# Patient Record
Sex: Female | Born: 1955 | Race: White | Hispanic: No | Marital: Married | Smoking: Former smoker
Health system: Southern US, Community
[De-identification: ages and names within clinical notes are randomized; demographics above are authoritative.]

## PROBLEM LIST (undated history)

## (undated) DIAGNOSIS — C801 Malignant (primary) neoplasm, unspecified: Secondary | ICD-10-CM

## (undated) DIAGNOSIS — N3010 Interstitial cystitis (chronic) without hematuria: Secondary | ICD-10-CM

## (undated) DIAGNOSIS — G43909 Migraine, unspecified, not intractable, without status migrainosus: Secondary | ICD-10-CM

## (undated) HISTORY — PX: ABDOMINAL HYSTERECTOMY: SHX81

---

## 2006-11-29 ENCOUNTER — Ambulatory Visit: Payer: Self-pay

## 2009-05-12 ENCOUNTER — Ambulatory Visit: Payer: Self-pay | Admitting: Family Medicine

## 2011-04-19 ENCOUNTER — Ambulatory Visit: Payer: Self-pay | Admitting: Family Medicine

## 2012-05-10 ENCOUNTER — Ambulatory Visit: Payer: Self-pay | Admitting: Family Medicine

## 2012-05-26 ENCOUNTER — Ambulatory Visit: Payer: Self-pay | Admitting: Medical

## 2014-12-30 ENCOUNTER — Ambulatory Visit: Payer: Self-pay | Admitting: Family Medicine

## 2016-04-16 ENCOUNTER — Encounter: Payer: Self-pay | Admitting: *Deleted

## 2016-04-16 ENCOUNTER — Ambulatory Visit
Admission: EM | Admit: 2016-04-16 | Discharge: 2016-04-16 | Disposition: A | Discharge status: Home / Self Care | Payer: Federal, State, Local not specified - PPO | Attending: Family Medicine | Admitting: Family Medicine

## 2016-04-16 DIAGNOSIS — B001 Herpesviral vesicular dermatitis: Secondary | ICD-10-CM | POA: Diagnosis not present

## 2016-04-16 HISTORY — DX: Migraine, unspecified, not intractable, without status migrainosus: G43.909

## 2016-04-16 HISTORY — DX: Interstitial cystitis (chronic) without hematuria: N30.10

## 2016-04-16 HISTORY — DX: Malignant (primary) neoplasm, unspecified: C80.1

## 2016-04-16 MED ORDER — PREDNISONE 50 MG PO TABS: ORAL_TABLET | ORAL | Status: DC

## 2016-04-16 MED ORDER — VALACYCLOVIR HCL 1 G PO TABS: 1000.0000 mg | ORAL_TABLET | Freq: Two times a day (BID) | ORAL | Status: AC

## 2016-04-16 MED ORDER — SULFAMETHOXAZOLE-TRIMETHOPRIM 800-160 MG PO TABS: 1.0000 | ORAL_TABLET | Freq: Two times a day (BID) | ORAL | Status: AC

## 2016-04-16 NOTE — Discharge Instructions (Signed)
Take the medications as prescribed.  Follow up if you worsen or fail to improve.  Take care  Dr. Cherre Robins Sore A cold sore (fever blister) is a skin infection caused by the herpes simplex virus (HSV-1). HSV-1 is closely related to the virus that causes genital herpes (HSV-2), but they are not the same even though both viruses can cause oral and genital infections. Cold sores are small, fluid-filled sores inside of the mouth or on the lips, gums, nose, chin, cheeks, or fingers.  The herpes simplex virus can be easily passed (contagious) to other people through close personal contact, such as kissing or sharing personal items. The virus can also spread to other parts of the body, such as the eyes or genitals. Cold sores are contagious until the sores crust over completely. They often heal within 2 weeks.  Once a person is infected, the herpes simplex virus remains permanently in the body. Therefore, there is no cure for cold sores, and they often recur when a person is tired, stressed, sick, or gets too much sun. Additional factors that can cause a recurrence include hormone changes in menstruation or pregnancy, certain drugs, and cold weather.  CAUSES  Cold sores are caused by the herpes simplex virus. The virus is spread from person to person through close contact, such as through kissing, touching the affected area, or sharing personal items such as lip balm, razors, or eating utensils.  SYMPTOMS  The first infection may not cause symptoms. If symptoms develop, the symptoms often go through different stages. Here is how a cold sore develops:   Tingling, itching, or burning is felt 1-2 days before the outbreak.   Fluid-filled blisters appear on the lips, inside the mouth, nose, or on the cheeks.   The blisters start to ooze clear fluid.   The blisters dry up and a yellow crust appears in its place.   The crust falls off.  Symptoms depend on whether it is the initial outbreak or a  recurrence. Some other symptoms with the first outbreak may include:   Fever.   Sore throat.   Headache.   Muscle aches.   Swollen neck glands.  DIAGNOSIS  A diagnosis is often made based on your symptoms and looking at the sores. Sometimes, a sore may be swabbed and then examined in the lab to make a final diagnosis. If the sores are not present, blood tests can find the herpes simplex virus.  TREATMENT  There is no cure for cold sores and no vaccine for the herpes simplex virus. Within 2 weeks, most cold sores go away on their own without treatment. Medicines cannot make the infection go away, but medicine can help relieve some of the pain associated with the sores, can work to stop the virus from multiplying, and can also shorten healing time. Medicine may be in the form of creams, gels, pills, or a shot.  HOME CARE INSTRUCTIONS   Only take over-the-counter or prescription medicines for pain, discomfort, or fever as directed by your caregiver. Do not use aspirin.   Use a cotton-tip swab to apply creams or gels to your sores.   Do not touch the sores or pick the scabs. Wash your hands often. Do not touch your eyes without washing your hands first.   Avoid kissing, oral sex, and sharing personal items until sores heal.   Apply an ice pack on your sores for 10-15 minutes to ease any discomfort.   Avoid hot, cold, or salty  foods because they may hurt your mouth. Eat a soft, bland diet to avoid irritating the sores. Use a straw to drink if you have pain when drinking out of a glass.   Keep sores clean and dry to prevent an infection of other tissues.   Avoid the sun and limit stress if these things trigger outbreaks. If sun causes cold sores, apply sunscreen on the lips before being out in the sun.  SEEK MEDICAL CARE IF:   You have a fever or persistent symptoms for more than 2-3 days.   You have a fever and your symptoms suddenly get worse.   You have pus, not  clear fluid, coming from the sores.   You have redness that is spreading.   You have pain or irritation in your eye.   You get sores on your genitals.   Your sores do not heal within 2 weeks.   You have a weakened immune system.   You have frequent recurrences of cold sores.  MAKE SURE YOU:   Understand these instructions.  Will watch your condition.  Will get help right away if you are not doing well or get worse.   This information is not intended to replace advice given to you by your health care provider. Make sure you discuss any questions you have with your health care provider.   Document Released: 11/24/2000 Document Revised: 12/18/2014 Document Reviewed: 04/10/2012 Elsevier Interactive Patient Education [employer].

## 2016-04-16 NOTE — ED Provider Notes (Signed)
CSN: [id number]     Arrival date & time 04/16/16  1327 History   First MD Initiated Contact with Patient 04/16/16 1433     Chief Complaint  Patient presents with  . Mouth Lesions   (Consider location/radiation/quality/duration/timing/severity/associated sxs/prior Treatment) HPI  60 year old female presents with complaints of a cold sore.  Patient states that on Wednesday she developed tingling of a portion of her upper lip. She subsequently developed a characteristic cold sore. This is worsened over the past few days and is now severe. She now has significant swelling and surrounding redness as well as crusting. She reports pain and tenderness. No medications or other interventions tried. No known exacerbating factors. No oral lesions. No other complaints at this time.  Past Medical History  Diagnosis Date  . Migraines   . Cancer ([place])   . Interstitial cystitis    Past Surgical History  Procedure Laterality Date  . Abdominal hysterectomy     History reviewed. No pertinent family history.   Social History  Substance Use Topics  . Smoking status: Current Every Day Smoker  . Smokeless tobacco: Never Used  . Alcohol Use: Yes   OB History    No data available     Review of Systems  Skin:       Cold sore.  All other systems reviewed and are negative.  Allergies  Penicillins  Home Medications   Prior to Admission medications   Medication Sig Start Date End Date Taking? Authorizing Provider  Ascorbic Acid (VITAMIN C PO) Take 1 tablet by mouth daily.   Yes Historical Provider, MD  aspirin 81 MG tablet Take 81 mg by mouth daily.   Yes Historical Provider, MD  losartan-hydrochlorothiazide (HYZAAR) 50-12.5 MG tablet Take 1 tablet by mouth daily.   Yes Historical Provider, MD  Multiple Vitamin (MULTIVITAMIN) tablet Take 1 tablet by mouth daily.   Yes Historical Provider, MD  pentosan polysulfate (ELMIRON) 100 MG capsule Take 100 mg by mouth 3 (three) times daily.   Yes  Historical Provider, MD  topiramate (TOPAMAX) 25 MG tablet Take 75 mg by mouth daily.   Yes Historical Provider, MD  Venlafaxine HCl 225 MG TB24 Take 225 mg by mouth daily.   Yes Historical Provider, MD  VITAMIN D, CHOLECALCIFEROL, PO Take 1 tablet by mouth daily.   Yes Historical Provider, MD  predniSONE (DELTASONE) 50 MG tablet 1 tablet daily x 5 days. 04/16/16   Coral Spikes, DO  sulfamethoxazole-trimethoprim (BACTRIM DS,SEPTRA DS) 800-160 MG tablet Take 1 tablet by mouth 2 (two) times daily. 04/16/16 04/23/16  Coral Spikes, DO  valACYclovir (VALTREX) 1000 MG tablet Take 1 tablet (1,000 mg total) by mouth 2 (two) times daily. 04/16/16 04/30/16  Coral Spikes, DO   Meds Ordered and Administered this Visit  Medications - No data to display  BP 148/78 mmHg  Pulse 82  Temp(Src) 98.1 F (36.7 C) (Oral)  Resp 18  Ht 5\' 6"  (1.676 m)  Wt 220 lb (99.791 kg)  BMI 35.53 kg/m2  SpO2 98% No data found.  Physical Exam  Constitutional: She is oriented to person, place, and time. She appears well-developed. No distress.  HENT:  Head: Normocephalic and atraumatic.  Mouth/Throat: Oropharynx is clear and moist.  Eyes: Conjunctivae are normal.  Neck: Normal range of motion.  Cardiovascular: Normal rate and regular rhythm.   Pulmonary/Chest: Effort normal. She has no wheezes. She has no rales.  Abdominal: She exhibits no distension.  Musculoskeletal: Normal range of motion. She  exhibits no edema.  Neurological: She is alert and oriented to person, place, and time.  Skin:  Upper lip - approximately 1.5-2 cm area of erythema and swelling with overlying crusting.  Psychiatric: She has a normal mood and affect.  Vitals reviewed.  ED Course  Procedures (including critical care time)  Labs Review Labs Reviewed - No data to display  Imaging Review No results found.  MDM   1. Cold sore    Patient with a large cold sore with associated erythema, swelling, & crusting. Treating with Valtrex. Covering  for possible bacterial superinfection with Bactrim. Steroids to be used swelling worsens.    Coral Spikes, DO 04/16/16 1524

## 2016-04-16 NOTE — ED Notes (Signed)
Patient started having symptoms of a fever blister 4 days ago and 3 days ago the blister erupted on her upper lip. Patient does have a history of fever blisters and has been treated successfully.

## 2021-06-09 ENCOUNTER — Other Ambulatory Visit: Payer: Self-pay | Admitting: Family Medicine

## 2021-06-09 DIAGNOSIS — N9489 Other specified conditions associated with female genital organs and menstrual cycle: Secondary | ICD-10-CM

## 2021-06-09 DIAGNOSIS — R102 Pelvic and perineal pain: Secondary | ICD-10-CM

## 2021-07-11 ENCOUNTER — Ambulatory Visit
Admission: RE | Admit: 2021-07-11 | Discharge: 2021-07-11 | Disposition: A | Discharge status: Home / Self Care | Payer: Medicare HMO | Source: Ambulatory Visit | Attending: Family Medicine | Admitting: Family Medicine

## 2021-07-11 DIAGNOSIS — N9489 Other specified conditions associated with female genital organs and menstrual cycle: Secondary | ICD-10-CM | POA: Diagnosis present

## 2021-12-29 ENCOUNTER — Other Ambulatory Visit: Payer: Self-pay | Admitting: Family Medicine

## 2021-12-29 DIAGNOSIS — Z1231 Encounter for screening mammogram for malignant neoplasm of breast: Secondary | ICD-10-CM

## 2022-01-02 ENCOUNTER — Ambulatory Visit
Admission: RE | Admit: 2022-01-02 | Discharge: 2022-01-02 | Disposition: A | Discharge status: Home / Self Care | Payer: Medicare HMO | Source: Ambulatory Visit | Attending: Family Medicine | Admitting: Family Medicine

## 2022-01-02 ENCOUNTER — Other Ambulatory Visit: Payer: Self-pay

## 2022-01-02 DIAGNOSIS — Z1231 Encounter for screening mammogram for malignant neoplasm of breast: Secondary | ICD-10-CM | POA: Insufficient documentation

## 2022-01-06 ENCOUNTER — Other Ambulatory Visit: Payer: Self-pay | Admitting: Family Medicine

## 2022-01-06 DIAGNOSIS — N6320 Unspecified lump in the left breast, unspecified quadrant: Secondary | ICD-10-CM

## 2022-01-06 DIAGNOSIS — R928 Other abnormal and inconclusive findings on diagnostic imaging of breast: Secondary | ICD-10-CM

## 2022-01-09 ENCOUNTER — Ambulatory Visit
Admission: RE | Admit: 2022-01-09 | Discharge: 2022-01-09 | Disposition: A | Discharge status: Home / Self Care | Payer: Medicare HMO | Source: Ambulatory Visit | Attending: Family Medicine | Admitting: Family Medicine

## 2022-01-09 ENCOUNTER — Other Ambulatory Visit: Payer: Self-pay

## 2022-01-09 DIAGNOSIS — N6320 Unspecified lump in the left breast, unspecified quadrant: Secondary | ICD-10-CM | POA: Insufficient documentation

## 2022-01-09 DIAGNOSIS — R928 Other abnormal and inconclusive findings on diagnostic imaging of breast: Secondary | ICD-10-CM | POA: Diagnosis not present

## 2022-10-17 ENCOUNTER — Other Ambulatory Visit: Payer: Self-pay | Admitting: Neurology

## 2022-10-17 DIAGNOSIS — G43919 Migraine, unspecified, intractable, without status migrainosus: Secondary | ICD-10-CM

## 2022-10-17 DIAGNOSIS — R4789 Other speech disturbances: Secondary | ICD-10-CM

## 2022-10-23 ENCOUNTER — Ambulatory Visit
Admission: RE | Admit: 2022-10-23 | Discharge: 2022-10-23 | Disposition: A | Discharge status: Home / Self Care | Payer: Medicare HMO | Source: Ambulatory Visit | Attending: Family Medicine | Admitting: Family Medicine

## 2022-10-23 ENCOUNTER — Ambulatory Visit
Admission: RE | Admit: 2022-10-23 | Discharge: 2022-10-23 | Disposition: A | Discharge status: Home / Self Care | Payer: Medicare HMO | Attending: Family Medicine | Admitting: Family Medicine

## 2022-10-23 ENCOUNTER — Other Ambulatory Visit: Payer: Self-pay | Admitting: Family Medicine

## 2022-10-23 DIAGNOSIS — R059 Cough, unspecified: Secondary | ICD-10-CM

## 2022-11-09 ENCOUNTER — Ambulatory Visit
Admission: RE | Admit: 2022-11-09 | Discharge: 2022-11-09 | Disposition: A | Discharge status: Home / Self Care | Payer: Medicare HMO | Source: Ambulatory Visit | Attending: Neurology | Admitting: Neurology

## 2022-11-09 DIAGNOSIS — G43919 Migraine, unspecified, intractable, without status migrainosus: Secondary | ICD-10-CM

## 2022-11-09 DIAGNOSIS — R4789 Other speech disturbances: Secondary | ICD-10-CM

## 2022-11-09 MED ORDER — GADOBENATE DIMEGLUMINE 529 MG/ML IV SOLN
20.0000 mL | Freq: Once | INTRAVENOUS | Status: AC | PRN
  Administered 2022-11-09: 20 mL via INTRAVENOUS

## 2023-01-01 ENCOUNTER — Telehealth: Payer: Self-pay | Admitting: *Deleted

## 2023-01-01 ENCOUNTER — Other Ambulatory Visit: Payer: Self-pay | Admitting: *Deleted

## 2023-01-01 DIAGNOSIS — Z122 Encounter for screening for malignant neoplasm of respiratory organs: Secondary | ICD-10-CM

## 2023-01-01 DIAGNOSIS — Z87891 Personal history of nicotine dependence: Secondary | ICD-10-CM

## 2023-01-01 NOTE — Telephone Encounter (Signed)
Spoke with patient Scheduled SD[facility] 02/14/2023 10am CT scheduled 02/15/2023 10am Pt voiced understanding and had no further questions.

## 2023-02-14 ENCOUNTER — Encounter: Payer: Self-pay | Admitting: Acute Care

## 2023-02-14 ENCOUNTER — Ambulatory Visit (INDEPENDENT_AMBULATORY_CARE_PROVIDER_SITE_OTHER): Payer: Medicare HMO | Admitting: Acute Care

## 2023-02-14 DIAGNOSIS — Z87891 Personal history of nicotine dependence: Secondary | ICD-10-CM | POA: Diagnosis not present

## 2023-02-14 NOTE — Progress Notes (Addendum)
Virtual Visit via Telephone Note  I connected with Tammie Rocha on 02/20/23 at 10:00 AM EST by telephone and verified that I am speaking with the correct person using two identifiers.  Location: Patient:  At home  Provider: [place], [place], [place], [address]    I discussed the limitations, risks, security and privacy concerns of performing an evaluation and management service by telephone and the availability of in person appointments. I also discussed with the patient that there may be a patient responsible charge related to this service. The patient expressed understanding and agreed to proceed.   Shared Decision Making Visit Lung Cancer Screening Program [phone])   Eligibility: Age 67 y.o. Pack Years Smoking History Calculation 63 pack years (# packs/per year x # years smoked) Recent History of coughing up blood  no Unexplained weight loss? no ( >Than 15 pounds within the last 6 months ) Prior History Lung / other cancer no (Diagnosis within the last 5 years already requiring surveillance chest CT Scans). Smoking Status Former Smoker Former Smokers: Years since quit: < 1 year  Quit Date: 11/2022  Visit Components: Discussion included one or more decision making aids. yes Discussion included risk/benefits of screening. yes Discussion included potential follow up diagnostic testing for abnormal scans. yes Discussion included meaning and risk of over diagnosis. yes Discussion included meaning and risk of False Positives. yes Discussion included meaning of total radiation exposure. yes  Counseling Included: Importance of adherence to annual lung cancer LDCT screening. yes Impact of comorbidities on ability to participate in the program. yes Ability and willingness to under diagnostic treatment. yes  Smoking Cessation Counseling: Current Smokers:  Discussed importance of smoking cessation. yes Information about tobacco cessation classes and  interventions provided to patient. yes Patient provided with "ticket" for LDCT Scan. yes Symptomatic Patient. no  Counseling NA Diagnosis Code: Tobacco Use Z72.0 Asymptomatic Patient yes  Counseling (Intermediate counseling: > three minutes counseling) [id number] Former Smokers:  Discussed the importance of maintaining cigarette abstinence. yes Diagnosis Code: Personal History of Nicotine Dependence. [id number] Information about tobacco cessation classes and interventions provided to patient. Yes Patient provided with "ticket" for LDCT Scan. yes Written Order for Lung Cancer Screening with LDCT placed in Epic. Yes (CT Chest Lung Cancer Screening Low Dose W/O CM) [id number] Z12.2-Screening of respiratory organs Z87.891-Personal history of nicotine dependence  I spent 25 minutes of face to face time/virtual visit time  with  Tammie Rocha discussing the risks and benefits of lung cancer screening. We took the time to pause the power point at intervals to allow for questions to be asked and answered to ensure understanding. We discussed that she had taken the single most powerful action possible to decrease her risk of developing lung cancer when she quit smoking. I counseled her to remain smoke free, and to contact me if she ever had the desire to smoke again so that I can provide resources and tools to help support the effort to remain smoke free. We discussed the time and location of the scan, and that either  Doroteo Glassman RN, Joella Prince, RN or I  or I will call / send a letter with the results within  24-72 hours of receiving them. She has the office contact information in the event she needs to speak with me,  she verbalized understanding of all of the above and had no further questions upon leaving the office.     I explained to the patient that there has  been a high incidence of coronary artery disease noted on these exams. I explained that this is a non-gated exam therefore degree or severity cannot  be determined. This patient is not on statin therapy. I have asked the patient to follow-up with their PCP regarding any incidental finding of coronary artery disease and management with diet or medication as they feel is clinically indicated. The patient verbalized understanding of the above and had no further questions.    Magdalen Spatz, NP 02/14/2023

## 2023-02-14 NOTE — Patient Instructions (Signed)
Thank you for participating in the [facility]. It was our pleasure to meet you today. We will call you with the results of your scan within the next few days. Your scan will be assigned a Lung RADS category score by the physicians reading the scans.  This Lung RADS score determines follow up scanning.  See below for description of categories, and follow up screening recommendations. We will be in touch to schedule your follow up screening annually or based on recommendations of our providers. We will fax a copy of your scan results to your Primary Care Physician, or the physician who referred you to the program, to ensure they have the results. Please call the office if you have any questions or concerns regarding your scanning experience or results.  Our office number is [phone]. Please speak with Denise Phelps, RN. , or  Denise Buckner RN, They are  our Lung Cancer Screening RN.'s If They are unavailable when you call, Please leave a message on the voice mail. We will return your call at our earliest convenience.This voice mail is monitored several times a day.  Remember, if your scan is normal, we will scan you annually as long as you continue to meet the criteria for the program. (Age 50-80, Current smoker or smoker who has quit within the last 15 years). If you are a smoker, remember, quitting is the single most powerful action that you can take to decrease your risk of lung cancer and other pulmonary, breathing related problems. We know quitting is hard, and we are here to help.  Please let us know if there is anything we can do to help you meet your goal of quitting. If you are a former smoker, congratulations. We are proud of you! Remain smoke free! Remember you can refer friends or family members through the number above.  We will screen them to make sure they meet criteria for the program. Thank you for helping us take better care of you by  participating in Lung Screening.  You can receive free nicotine replacement therapy ( patches, gum or mints) by calling 1-800-QUIT NOW. Please call so we can get you on the path to becoming  a non-smoker. I know it is hard, but you can do this!  Lung RADS Categories:  Lung RADS 1: no nodules or definitely non-concerning nodules.  Recommendation is for a repeat annual scan in 12 months.  Lung RADS 2:  nodules that are non-concerning in appearance and behavior with a very low likelihood of becoming an active cancer. Recommendation is for a repeat annual scan in 12 months.  Lung RADS 3: nodules that are probably non-concerning , includes nodules with a low likelihood of becoming an active cancer.  Recommendation is for a 6-month repeat screening scan. Often noted after an upper respiratory illness. We will be in touch to make sure you have no questions, and to schedule your 6-month scan.  Lung RADS 4 A: nodules with concerning findings, recommendation is most often for a follow up scan in 3 months or additional testing based on our provider's assessment of the scan. We will be in touch to make sure you have no questions and to schedule the recommended 3 month follow up scan.  Lung RADS 4 B:  indicates findings that are concerning. We will be in touch with you to schedule additional diagnostic testing based on our provider's  assessment of the scan.  Other options for assistance in smoking cessation (   As covered by your insurance benefits)  Hypnosis for smoking cessation  [employer] [phone]  Acupuncture for smoking cessation  [facility] [phone]   

## 2023-02-15 ENCOUNTER — Ambulatory Visit
Admission: RE | Admit: 2023-02-15 | Discharge: 2023-02-15 | Disposition: A | Discharge status: Home / Self Care | Payer: Medicare HMO | Source: Ambulatory Visit | Attending: Acute Care | Admitting: Acute Care

## 2023-02-15 DIAGNOSIS — Z122 Encounter for screening for malignant neoplasm of respiratory organs: Secondary | ICD-10-CM

## 2023-02-15 DIAGNOSIS — Z87891 Personal history of nicotine dependence: Secondary | ICD-10-CM

## 2023-02-19 ENCOUNTER — Telehealth: Payer: Self-pay | Admitting: *Deleted

## 2023-02-19 ENCOUNTER — Other Ambulatory Visit: Payer: Self-pay

## 2023-02-19 DIAGNOSIS — J849 Interstitial pulmonary disease, unspecified: Secondary | ICD-10-CM

## 2023-02-19 DIAGNOSIS — Z87891 Personal history of nicotine dependence: Secondary | ICD-10-CM

## 2023-02-19 DIAGNOSIS — Z122 Encounter for screening for malignant neoplasm of respiratory organs: Secondary | ICD-10-CM

## 2023-02-19 NOTE — Telephone Encounter (Signed)
Spoke with pt regarding lung screening CT results. Small nodules noted that will be followed at yearly scan. Discussed ILD findings. Pt did report having pneumonia in 11/2022 and also state that her Father had Pulmonary Fibrosis. Pt did want to be referred to see pulmonary doctor regarding this. I advised pt that I would place that referral for our [place] office . Pt verbalized understanding and is aware that we will repeat her lung screening CT in 1 year. Results/ plans faxed to PCP.

## 2023-03-21 ENCOUNTER — Encounter: Payer: Self-pay | Admitting: Student in an Organized Health Care Education/Training Program

## 2023-03-21 ENCOUNTER — Ambulatory Visit (INDEPENDENT_AMBULATORY_CARE_PROVIDER_SITE_OTHER): Payer: Medicare HMO | Admitting: Student in an Organized Health Care Education/Training Program

## 2023-03-21 VITALS — BP 138/82 | HR 92 | Temp 97.8°F | Ht 66.0 in | Wt 233.0 lb

## 2023-03-21 DIAGNOSIS — J849 Interstitial pulmonary disease, unspecified: Secondary | ICD-10-CM | POA: Diagnosis not present

## 2023-03-21 NOTE — Progress Notes (Signed)
Synopsis: Referred in for evaluation of ILD  Assessment & Plan:   1. ILD (interstitial lung disease)  Presenting for evaluation of shortness of breath as well as findings of ILD on her low-dose CT.  I reviewed the CT scan and I do not see a craniocaudal gradient in terms of the subpleural reticulation.  There was no groundglass or nodularity on my review of the CT.  That said, it is a low-dose CT and I will order a dedicated high-resolution CT scan of the chest to further evaluate these findings.  I will also obtain blood work to include autoimmune workup (ANA, ANCA, rheumatoid, my marker), hypersensitivity panel, an allergen panel, pulmonary function testing, and a double contrast esophagogram for completion of ILD workup.  I will present her case at our multidisciplinary ILD conference.  Patient and husband will investigate any findings of mold in their house.  I did discuss with the patient that should all this workup returned nonrevealing, we might have to resort to lung biopsy versus bronchoscopy with BAL/transbronchial biopsies for tissue acquisition to help solidify the diagnosis.  This is especially imperative given the patient's family history of ILD.  I also explained to her that pulm function testing will evaluate for any obstructive lung disease and I will prescribe her inhaler should that be the case. I will be presenting her case at our multi-disciplinary ILD conference.  - Aldolase - ANA 12 Plus Profile (RDL) - ANCA Profile - Anti-CCP Ab, IgG + IgA (RDL) - CK - Rheumatoid factor - MyoMarker 3 Plus Profile (RDL) - Hypersensitivity Pneumonitis - CT CHEST HIGH RESOLUTION; Future - Pulmonary Function Test [facility] Only; Future - DG ESOPHAGUS W DOUBLE CM (HD); Future - Allergen Panel (27) + IGE   Return in about 4 weeks (around 04/18/2023).  I spent 60 minutes caring for this patient today, including preparing to see the patient, obtaining a medical history , reviewing a  separately obtained history, performing a medically appropriate examination and/or evaluation, counseling and educating the patient/family/caregiver, ordering medications, tests, or procedures, referring and communicating with other health care professionals (not separately reported), documenting clinical information in the electronic health record, and independently interpreting results (not separately reported/billed) and communicating results to the patient/family/caregiver  Raechel Chute, MD [facility] 03/21/2023 11:10 AM    End of visit medications:  No orders of the defined types were placed in this encounter.    Current Outpatient Medications:    AIMOVIG 140 MG/ML SOAJ, SMARTSIG:140 Milligram(s) SUB-Q Once a Month, Disp: , Rfl:    ALPRAZolam (XANAX) 0.5 MG tablet, Take 0.5 mg by mouth at bedtime as needed., Disp: , Rfl:    Ascorbic Acid (VITAMIN C PO), Take 1 tablet by mouth daily., Disp: , Rfl:    aspirin 81 MG tablet, Take 81 mg by mouth daily., Disp: , Rfl:    MAGNESIUM PO, Take 1,000 mg by mouth daily., Disp: , Rfl:    metoprolol succinate (TOPROL-XL) 100 MG 24 hr tablet, Take 100 mg by mouth daily., Disp: , Rfl:    Multiple Vitamin (MULTIVITAMIN) tablet, Take 1 tablet by mouth daily., Disp: , Rfl:    nortriptyline (PAMELOR) 50 MG capsule, Take 100 mg by mouth daily., Disp: , Rfl:    NURTEC 75 MG TBDP, Take 1 tablet by mouth as directed., Disp: , Rfl:    omeprazole (PRILOSEC) 40 MG capsule, Take by mouth., Disp: , Rfl:    oxyCODONE-acetaminophen (PERCOCET/ROXICET) 5-325 MG tablet, Take by mouth., Disp: ,  Rfl:    pentosan polysulfate (ELMIRON) 100 MG capsule, Take 100 mg by mouth 3 (three) times daily., Disp: , Rfl:    Venlafaxine HCl 225 MG TB24, Take 225 mg by mouth daily., Disp: , Rfl:    VITAMIN D, CHOLECALCIFEROL, PO, Take 1 tablet by mouth daily., Disp: , Rfl:    Subjective:   PATIENT ID: Tammie Rocha GENDER: female DOB: 1956/03/25, MRN:  [id number]  Chief Complaint  Patient presents with   PULMONARY CONSULT    CT 3/8-SOB with exertion     HPI  67 year old female presenting to clinic for the evaluation of findings on low-dose CT consistent with ILD.  Patient reports that she had pneumonia around November/December 2023 after which she never went back to her baseline.  Her symptoms mainly consist of shortness of breath with exertion as well as a cough.  She feels that walking a couple 100 yards leaves her winded. The cough is non-productive. There is no chest pain or chest tightness reported, no rashes, and no joint swelling.  Patient has a history of smoking, having smoked for 50 years with around 60 pack years of smoking history.  Patient quit in December 2023.  She was enrolled in our lung cancer screening program and underwent her first low-dose CT which was notable for subpleural reticulation consistent with ILD.  She is referred to discuss this further.  Patient comes in with her ILD questionnaire filled out.  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  She does tell us that that building had asbestos that was remediated.  While the asbestos was being remediated, employees were asked to continue working.  She lives in the same house for many decades, house built in [address], has a crawl space and no basement.  Patient and her husband report seeing some signs of water damage in the ceiling, mildew in the bathroom, and possibly some mold in the bathroom ceiling.  Patient reports that her father died of pulmonary fibrosis at the age of 62, and was very sick in the last few years of his life.  Ancillary information including prior medications, full medical/surgical/family/social histories, and PFTs (when available) are listed below and have been reviewed.   Review of Systems  Constitutional:  Negative for chills, fever, malaise/fatigue and weight  loss.  Respiratory:  Positive for cough and shortness of breath. Negative for hemoptysis, sputum production and wheezing.   Cardiovascular:  Negative for chest pain, leg swelling and PND.  Skin:  Negative for rash.     Objective:   Vitals:   03/21/23 1023  BP: 138/82  Pulse: 92  Temp: 97.8 F (36.6 C)  TempSrc: Temporal  SpO2: 95%  Weight: 233 lb (105.7 kg)  Height: 5\' 6"  (1.676 m)   95% on RA BMI Readings from Last 3 Encounters:  03/21/23 37.61 kg/m  04/16/16 35.51 kg/m   Wt Readings from Last 3 Encounters:  03/21/23 233 lb (105.7 kg)  04/16/16 220 lb (99.8 kg)    Physical Exam Constitutional:      General: She is not in acute distress.    Appearance: Normal appearance. She is not ill-appearing.  HENT:     Head: Normocephalic.     Mouth/Throat:     Mouth: Mucous membranes are moist.  Cardiovascular:     Rate and Rhythm: Normal rate and regular rhythm.     Pulses: Normal pulses.  Heart sounds: Normal heart sounds.  Pulmonary:     Effort: Pulmonary effort is normal.     Breath sounds: Rales (bibasilar rales) present. No wheezing or rhonchi.  Abdominal:     Palpations: Abdomen is soft.  Neurological:     General: No focal deficit present.     Mental Status: She is alert and oriented to person, place, and time. Mental status is at baseline.       Ancillary Information    Past Medical History:  Diagnosis Date   Cancer    skin ca   Interstitial cystitis    Migraines      Family History  Problem Relation Age of Onset   Breast cancer Neg Hx      Past Surgical History:  Procedure Laterality Date   ABDOMINAL HYSTERECTOMY      Social History   Socioeconomic History   Marital status: Married    Spouse name: Not on file   Number of children: Not on file   Years of education: Not on file   Highest education level: Not on file  Occupational History   Not on file  Tobacco Use   Smoking status: Former    Packs/day: 1.25    Years: 50.00     Additional pack years: 0.00    Total pack years: 62.50    Types: Cigarettes    Quit date: 11/2022    Years since quitting: 0.3   Smokeless tobacco: Former  Substance and Sexual Activity   Alcohol use: Yes   Drug use: No   Sexual activity: Not on file  Other Topics Concern   Not on file  Social History Narrative   Not on file   Social Determinants of Health   Financial Resource Strain: Not on file  Food Insecurity: Not on file  Transportation Needs: Not on file  Physical Activity: Not on file  Stress: Not on file  Social Connections: Not on file  Intimate Partner Violence: Not on file     Allergies  Allergen Reactions   Penicillins Itching     CBC No results found for: "WBC", "RBC", "HGB", "HCT", "PLT", "MCV", "MCH", "MCHC", "RDW", "LYMPHSABS", "MONOABS", "EOSABS", "BASOSABS"  Pulmonary Functions Testing Results:     No data to display          Outpatient Medications Prior to Visit  Medication Sig Dispense Refill   AIMOVIG 140 MG/ML SOAJ SMARTSIG:140 Milligram(s) SUB-Q Once a Month     ALPRAZolam (XANAX) 0.5 MG tablet Take 0.5 mg by mouth at bedtime as needed.     Ascorbic Acid (VITAMIN C PO) Take 1 tablet by mouth daily.     aspirin 81 MG tablet Take 81 mg by mouth daily.     MAGNESIUM PO Take 1,000 mg by mouth daily.     metoprolol succinate (TOPROL-XL) 100 MG 24 hr tablet Take 100 mg by mouth daily.     Multiple Vitamin (MULTIVITAMIN) tablet Take 1 tablet by mouth daily.     nortriptyline (PAMELOR) 50 MG capsule Take 100 mg by mouth daily.     NURTEC 75 MG TBDP Take 1 tablet by mouth as directed.     omeprazole (PRILOSEC) 40 MG capsule Take by mouth.     oxyCODONE-acetaminophen (PERCOCET/ROXICET) 5-325 MG tablet Take by mouth.     pentosan polysulfate (ELMIRON) 100 MG capsule Take 100 mg by mouth 3 (three) times daily.     Venlafaxine HCl 225 MG TB24 Take 225 mg by mouth daily.  VITAMIN D, CHOLECALCIFEROL, PO Take 1 tablet by mouth daily.     topiramate  (TOPAMAX) 25 MG tablet Take 75 mg by mouth daily.     losartan-hydrochlorothiazide (HYZAAR) 50-12.5 MG tablet Take 1 tablet by mouth daily.     predniSONE (DELTASONE) 50 MG tablet 1 tablet daily x 5 days. 5 tablet 0   No facility-administered medications prior to visit.

## 2023-03-27 ENCOUNTER — Encounter: Payer: Self-pay | Admitting: Radiology

## 2023-03-27 ENCOUNTER — Ambulatory Visit
Admission: RE | Admit: 2023-03-27 | Discharge: 2023-03-27 | Disposition: A | Discharge status: Home / Self Care | Payer: Medicare HMO | Source: Ambulatory Visit | Attending: Student in an Organized Health Care Education/Training Program | Admitting: Student in an Organized Health Care Education/Training Program

## 2023-03-27 DIAGNOSIS — J849 Interstitial pulmonary disease, unspecified: Secondary | ICD-10-CM | POA: Insufficient documentation

## 2023-04-05 ENCOUNTER — Ambulatory Visit: Payer: Medicare HMO | Attending: Student in an Organized Health Care Education/Training Program

## 2023-04-05 DIAGNOSIS — Z87891 Personal history of nicotine dependence: Secondary | ICD-10-CM | POA: Diagnosis not present

## 2023-04-05 DIAGNOSIS — R942 Abnormal results of pulmonary function studies: Secondary | ICD-10-CM | POA: Insufficient documentation

## 2023-04-05 DIAGNOSIS — J849 Interstitial pulmonary disease, unspecified: Secondary | ICD-10-CM

## 2023-04-05 LAB — PULMONARY FUNCTION TEST ARMC ONLY
DL/VA % pred: 68 %
DL/VA: 2.81 ml/min/mmHg/L
DLCO unc % pred: 58 %
DLCO unc: 12.23 ml/min/mmHg
FEF 25-75 Post: 1.37 L/sec
FEF 25-75 Pre: 4.08 L/sec
FEF2575-%Change-Post: -66 %
FEF2575-%Pred-Post: 66 %
FEF2575-%Pred-Pre: 197 %
FEV1-%Change-Post: -24 %
FEV1-%Pred-Post: 66 %
FEV1-%Pred-Pre: 88 %
FEV1-Post: 1.67 L
FEV1-Pre: 2.2 L
FEV1FVC-%Change-Post: -32 %
FEV1FVC-%Pred-Pre: 113 %
FEV6-%Change-Post: 23 %
FEV6-%Pred-Post: 87 %
FEV6-%Pred-Pre: 70 %
FEV6-Post: 2.74 L
FEV6-Pre: 2.22 L
FEV6FVC-%Pred-Post: 104 %
FEV6FVC-%Pred-Pre: 104 %
FVC-%Change-Post: 12 %
FVC-%Pred-Post: 86 %
FVC-%Pred-Pre: 77 %
FVC-Post: 2.84 L
FVC-Pre: 2.53 L
Post FEV1/FVC ratio: 59 %
Post FEV6/FVC ratio: 100 %
Pre FEV1/FVC ratio: 87 %
Pre FEV6/FVC Ratio: 100 %
RV % pred: 78 %
RV: 1.78 L
TLC % pred: 90 %
TLC: 4.83 L

## 2023-04-05 MED ORDER — ALBUTEROL SULFATE (2.5 MG/3ML) 0.083% IN NEBU
2.5000 mg | INHALATION_SOLUTION | Freq: Once | RESPIRATORY_TRACT | Status: AC
  Administered 2023-04-05: 2.5 mg via RESPIRATORY_TRACT
  Filled 2023-04-05: qty 3

## 2023-04-11 ENCOUNTER — Ambulatory Visit
Admission: RE | Admit: 2023-04-11 | Discharge: 2023-04-11 | Disposition: A | Discharge status: Home / Self Care | Payer: Medicare HMO | Source: Ambulatory Visit | Attending: Student in an Organized Health Care Education/Training Program | Admitting: Student in an Organized Health Care Education/Training Program

## 2023-04-11 DIAGNOSIS — J849 Interstitial pulmonary disease, unspecified: Secondary | ICD-10-CM

## 2023-04-12 LAB — ALLERGEN PANEL (27) + IGE

## 2023-04-12 LAB — MYOMARKER 3 PLUS PROFILE (RDL)

## 2023-04-13 LAB — ALLERGEN PANEL (27) + IGE

## 2023-04-13 LAB — ANCA PROFILE: Anti-MPO Antibodies: <0.2 units (ref 0.0–0.9)

## 2023-04-13 LAB — HYPERSENSITIVITY PNEUMONITIS

## 2023-04-14 LAB — ANCA PROFILE
Anti-PR3 Antibodies: <0.2 units (ref 0.0–0.9)
Atypical pANCA: <1:20 {titer}

## 2023-04-14 LAB — MYOMARKER 3 PLUS PROFILE (RDL)

## 2023-04-16 LAB — ALLERGEN PANEL (27) + IGE
D Farinae IgE: <0.1 kU/L
D Pteronyssinus IgE: <0.1 kU/L
Pigweed, Rough IgE: <0.1 kU/L

## 2023-04-16 LAB — HYPERSENSITIVITY PNEUMONITIS
Micropolyspora faeni, IgG: NEGATIVE
Thermoact. Saccharii: NEGATIVE

## 2023-04-16 LAB — MYOMARKER 3 PLUS PROFILE (RDL)

## 2023-04-17 LAB — MYOMARKER 3 PLUS PROFILE (RDL): Anti-SS-A 52kD Ab, IgG (RDL): <20 Units (ref <20)

## 2023-04-17 LAB — ALLERGEN PANEL (27) + IGE
Cocklebur IgE: <0.1 kU/L
Setomelanomma Rostrat: <0.1 kU/L

## 2023-04-17 LAB — ANCA PROFILE: C-ANCA: <1:20 {titer}

## 2023-04-18 LAB — ALLERGEN PANEL (27) + IGE: Kentucky Bluegrass IgE: <0.1 kU/L

## 2023-04-18 LAB — MYOMARKER 3 PLUS PROFILE (RDL): Anti-SAE1 Ab, IgG (RDL): <20 Units (ref <20)

## 2023-04-18 LAB — HYPERSENSITIVITY PNEUMONITIS: A.Fumigatus #1 Abs: NEGATIVE

## 2023-04-19 LAB — ALLERGEN PANEL (27) + IGE
Dog Dander IgE: <0.1 kU/L
Elm, American IgE: <0.1 kU/L
Johnson Grass IgE: <0.1 kU/L

## 2023-04-19 LAB — ALDOLASE: Aldolase: 8 U/L (ref 3.3–10.3)

## 2023-04-20 LAB — ANCA PROFILE: P-ANCA: <1:20 {titer}

## 2023-04-20 LAB — ALLERGEN PANEL (27) + IGE
Bahia Grass IgE: <0.1 kU/L
Timothy Grass IgE: <0.1 kU/L

## 2023-04-20 LAB — ANA 12 PLUS PROFILE (RDL): Anti-Nuclear Ab by IFA (RDL): NEGATIVE

## 2023-04-20 LAB — MYOMARKER 3 PLUS PROFILE (RDL)

## 2023-04-20 LAB — ANTI-CCP AB, IGG + IGA (RDL): Anti-CCP Ab, IgG + IgA (RDL): <20 Units (ref <20)

## 2023-04-21 LAB — MYOMARKER 3 PLUS PROFILE (RDL)
Anti-NXP-2 (P140) Ab (RDL): <20 Units (ref <20)
Anti-TIF-1gamma Ab (RDL): <20 Units (ref <20)

## 2023-04-22 LAB — MYOMARKER 3 PLUS PROFILE (RDL)

## 2023-04-22 LAB — ANTI-RO/NEG ANA: Anti-Ro (SS-A) Ab (RDL): <20 Units (ref <20)

## 2023-04-23 LAB — ALLERGEN PANEL (27) + IGE: Alternaria Alternata IgE: <0.1 kU/L

## 2023-04-23 LAB — HYPERSENSITIVITY PNEUMONITIS
A. Pullulans Abs: NEGATIVE
Thermoactinomyces vulgaris, IgG: NEGATIVE

## 2023-04-23 LAB — MYOMARKER 3 PLUS PROFILE (RDL): Anti-MDA-5 Ab (CADM-140)(RDL): <20 Units (ref <20)

## 2023-04-24 ENCOUNTER — Ambulatory Visit: Payer: Self-pay | Admitting: Internal Medicine

## 2023-04-24 LAB — ALLERGEN PANEL (27) + IGE
Bermuda Grass IgE: <0.1 kU/L
Mucor Racemosus IgE: <0.1 kU/L
Plantain, English IgE: <0.1 kU/L

## 2023-04-24 LAB — CK: Total CK: 71 U/L (ref 32–182)

## 2023-04-24 LAB — MYOMARKER 3 PLUS PROFILE (RDL): Anti-U1 RNP Ab (RDL): <20 Units (ref <20)

## 2023-04-25 LAB — ALLERGEN PANEL (27) + IGE
Aspergillus Fumigatus IgE: <0.1 kU/L
Cedar, Mountain IgE: <0.1 kU/L
Hickory, White IgE: <0.1 kU/L
White Mulberry IgE: <0.1 kU/L

## 2023-04-25 LAB — MYOMARKER 3 PLUS PROFILE (RDL): Anti-Jo-1 Ab (RDL): <20 Units (ref <20)

## 2023-04-26 LAB — ALLERGEN PANEL (27) + IGE
Cladosporium Herbarum IgE: <0.1 kU/L
Common Silver Birch IgE: <0.1 kU/L
Oak, White IgE: <0.1 kU/L

## 2023-04-26 LAB — RHEUMATOID FACTOR: Rheumatoid fact SerPl-aCnc: <10 IU/mL (ref <14.0)

## 2023-04-27 ENCOUNTER — Ambulatory Visit (INDEPENDENT_AMBULATORY_CARE_PROVIDER_SITE_OTHER): Payer: Medicare HMO | Admitting: Student in an Organized Health Care Education/Training Program

## 2023-04-27 ENCOUNTER — Encounter: Payer: Self-pay | Admitting: Student in an Organized Health Care Education/Training Program

## 2023-04-27 VITALS — BP 134/78 | HR 110 | Temp 97.6°F | Ht 66.0 in | Wt 228.2 lb

## 2023-04-27 DIAGNOSIS — J849 Interstitial pulmonary disease, unspecified: Secondary | ICD-10-CM

## 2023-04-27 NOTE — Progress Notes (Signed)
Interstitial Lung Disease Multidisciplinary Conference   Ashauna Burggraf    MRN [id number]    DOB 1956-05-29  Primary Care Physician:Bliss, Doreene Nest, MD  Referring Physician: Dr. Aundria Rud  Time of Conference: 7.00am- 8.00am Date of conference: 04/24/2023 Location of Conference: -  Virtual  Participating Pulmonary: Dr. Kalman Shan, Dr Chilton Greathouse, Dr. Melody Comas Pathology:  Radiology: Dr Cleone Slim  Others:   Brief History: 89F presented for evaluation of subpleural reticulation noted on LDCT for lung cancer screening. Mild reticulation, ordering dedicated HRCT for. History from the patient notable for mold at her house, but not much more (ILD questionnaire essentially negative). Family history positive for pulmonary fibrosis in father (died at 47). Former smoker (60 pack years) and she quit last year.   PFT    Latest Ref Rng & Units 04/05/2023    1:34 PM  PFT Results  FVC-Pre L 2.53   FVC-Predicted Pre % 77   FVC-Post L 2.84   FVC-Predicted Post % 86   Pre FEV1/FVC % % 87   Post FEV1/FCV % % 59   FEV1-Pre L 2.20   FEV1-Predicted Pre % 88   FEV1-Post L 1.67   DLCO uncorrected ml/min/mmHg 12.23   DLCO UNC% % 58   DLVA Predicted % 68   TLC L 4.83   TLC % Predicted % 90   RV % Predicted % 78       MDD discussion of CT scan    - Date or time period of scan: HRCT: 04/11/2023  - Discussion synopsis:  Mild TB. No gradient. No HC. No AT. No change from recent lung cancer screen .   - What is the final conclusion per 2018 ATS/Fleischner Criteria -  INDETERMINATE for UIP  - Concordance with official report:  discordant from official read  Pathology discussion of biopsy: n/a    MDD Impression/Recs: fairly high prob clinically for IPF given age, smoking and family hx. But given indeterminate CT - recommend Bronch BAL with envisia as first step OR straight SLB. Also refer Maylon Cos genetics conselor   Time Spent in preparation and discussion:  >  30 min    SIGNATURE   Dr. Kalman Shan, M.D., F.C.C.P,  Pulmonary and Critical Care Medicine Staff Physician, [facility] Director - Interstitial Lung Disease  Program  Pulmonary Fibrosis [facility] at [facility] [place], [place], [address]  Pager: [phone], If no answer or between  15:00h - 7:00h: call [phone] Telephone: [phone]  12:17 PM 04/27/2023 ...................................................................................................................Marland Kitchen References: Diagnosis of Hypersensitivity Pneumonitis in Adults. An Official ATS/JRS/ALAT Clinical Practice Guideline. Ragu G et al, Am J Respir Crit Care Med. 2020 Aug 1;202(3):e36-e69.       Diagnosis of Idiopathic Pulmonary Fibrosis. An Official ATS/ERS/JRS/ALAT Clinical Practice Guideline. Raghu G et al, Am J Respir Crit Care Med. 2018 Sep 1;198(5):e44-e68.   IPF Suspected   Histopath ology Pattern      UIP  Probable UIP  Indeterminate for  UIP  Alternative  diagnosis    UIP  IPF  IPF  IPF  Non-IPF dx   HRCT   Probabe UIP  IPF  IPF  IPF (Likely)**  Non-IPF dx  Pattern  Indeterminate for UIP  IPF  IPF (Likely)**  Indeterminate  for IPF**  Non-IPF dx    Alternative diagnosis  IPF (Likely)**/ non-IPF dx  Non-IPF dx  Non-IPF dx  Non-IPF dx     Idiopathic pulmonary  fibrosis diagnosis based upon HRCT and Biopsy paterns.  ** IPF is the likely diagnosis when any of following features are present:  Moderate-to-severe traction bronchiectasis/bronchiolectasis (defined as mild traction bronchiectasis/bronchiolectasis in four or more lobes including the lingual as a lobe, or moderate to severe traction bronchiectasis in two or more lobes) in a man over age 60 years or in a woman over age 81 years Extensive (>30%) reticulation on HRCT and an age >70 years  Increased neutrophils and/or absence of lymphocytosis in BAL fluid   Multidisciplinary discussion reaches a confident diagnosis of IPF.   **Indeterminate for IPF  Without an adequate biopsy is unlikely to be IPF  With an adequate biopsy may be reclassified to a more specific diagnosis after multidisciplinary discussion and/or additional consultation.   dx = diagnosis; HRCT = high-resolution computed tomography; IPF = idiopathic pulmonary fibrosis; UIP = usual interstitial pneumonia.

## 2023-04-27 NOTE — Progress Notes (Signed)
Synopsis: Referred in for ILD by Dortha Kern, MD  Assessment & Plan:   1. ILD (interstitial lung disease) (HCC)  Presenting for evaluation of shortness of breath as well as findings of ILD on her low-dose CT. High resolution chest CT ordered showed a pattern that is indeterminate for UIP. Auto-immune workup ordered during the last visit was all negative (ANA, ANCA, CCP, RF, MyoMarker, Hypersensitivity pneumonitis panel, allergen panel, aldolase, and CK). PFT's showed a notable drop in DLCO, and double contrast esophagogram showed reflux disease  Given findings on her CT that are indeterminate, and no findings of ground glass on the high resolution CT, I don't believe that flexible bronchoscopy with transbronchial biopsies would yield much information into the nature of the underlying process. Given this and the negative workup overall, I will refer her to thoracic surgery for consideration for VATS/RATS wedge biopsy.  - Ambulatory referral to Cardiothoracic Surgery   Return in about 3 months (around 07/28/2023).  I spent 20 minutes caring for this patient today, including preparing to see the patient, obtaining a medical history , reviewing a separately obtained history, performing a medically appropriate examination and/or evaluation, counseling and educating the patient/family/caregiver, referring and communicating with other health care professionals (not separately reported), documenting clinical information in the electronic health record, and independently interpreting results (not separately reported/billed) and communicating results to the patient/family/caregiver  Raechel Chute, MD [facility] 04/27/2023 9:51 PM    End of visit medications:  No orders of the defined types were placed in this encounter.    Current Outpatient Medications:    AIMOVIG 140 MG/ML SOAJ, SMARTSIG:140 Milligram(s) SUB-Q Once a Month, Disp: , Rfl:    ALPRAZolam (XANAX) 0.5 MG  tablet, Take 0.5 mg by mouth at bedtime as needed., Disp: , Rfl:    Ascorbic Acid (VITAMIN C PO), Take 1 tablet by mouth daily., Disp: , Rfl:    aspirin 81 MG tablet, Take 81 mg by mouth daily., Disp: , Rfl:    MAGNESIUM PO, Take 1,000 mg by mouth daily., Disp: , Rfl:    metoprolol succinate (TOPROL-XL) 100 MG 24 hr tablet, Take 100 mg by mouth daily., Disp: , Rfl:    Multiple Vitamin (MULTIVITAMIN) tablet, Take 1 tablet by mouth daily., Disp: , Rfl:    nortriptyline (PAMELOR) 50 MG capsule, Take 100 mg by mouth daily., Disp: , Rfl:    NURTEC 75 MG TBDP, Take 1 tablet by mouth as directed., Disp: , Rfl:    omeprazole (PRILOSEC) 40 MG capsule, Take by mouth., Disp: , Rfl:    oxyCODONE-acetaminophen (PERCOCET/ROXICET) 5-325 MG tablet, Take by mouth., Disp: , Rfl:    pentosan polysulfate (ELMIRON) 100 MG capsule, Take 100 mg by mouth 3 (three) times daily., Disp: , Rfl:    Venlafaxine HCl 225 MG TB24, Take 225 mg by mouth daily., Disp: , Rfl:    VITAMIN D, CHOLECALCIFEROL, PO, Take 1 tablet by mouth daily., Disp: , Rfl:    Subjective:   PATIENT ID: Tammie Rocha GENDER: female DOB: [birth year], MRN: [id number]  Chief Complaint  Patient presents with   Follow-up    CT 04/15/2023-SOB with exertion.     HPI  67 year old female presenting to clinic for the evaluation of ILD.   Patient reports that she had pneumonia around November/December 2023 after which she never went back to her baseline.  Her symptoms mainly consist of shortness of breath with exertion as well as a cough.  She feels that walking  a couple 100 yards leaves her winded. The cough is non-productive. There is no chest pain or chest tightness reported, no rashes, and no joint swelling.  Patient has a history of smoking, having smoked for 50 years with around 60 pack years of smoking history.  Patient quit in December 2023.  She was enrolled in our lung cancer screening program and underwent her first low-dose CT which was  notable for subpleural reticulation consistent with ILD for which she was referred.  I saw her in April and we ordered a high resolution chest CT. Her case was also discussed at the multi-disciplinary ILD conference. She also had her PFT's prior to today's visit.   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  She does tell us that that building had asbestos that was remediated.  While the asbestos was being remediated, employees were asked to continue working.  She lives in the same house for many decades, house built in [address], has a crawl space and no basement.  Patient and her husband report seeing some signs of water damage in the ceiling, mildew in the bathroom, and possibly some mold in the bathroom ceiling. That have not had any professional assess for mold yet (their grand daughter has been sick and had to be admitted to a pediatric hospital). Patient reports that her father died of pulmonary fibrosis at the age of 42, and was very sick in the last few years of his life.  Ancillary information including prior medications, full medical/surgical/family/social histories, and PFTs (when available) are listed below and have been reviewed.   Review of Systems  Constitutional:  Negative for chills, fever, malaise/fatigue and weight loss.  Respiratory:  Positive for cough and shortness of breath. Negative for hemoptysis, sputum production and wheezing.   Cardiovascular:  Negative for chest pain, leg swelling and PND.  Skin:  Negative for rash.     Objective:   Vitals:   04/27/23 1125  BP: 134/78  Pulse: (!) 110  Temp: 97.6 F (36.4 C)  TempSrc: Temporal  SpO2: 95%  Weight: 228 lb 3.2 oz (103.5 kg)  Height: 5\' 6"  (1.676 m)   95% on RA  BMI Readings from Last 3 Encounters:  04/27/23 36.83 kg/m  03/21/23 37.61 kg/m  04/16/16 35.51 kg/m   Wt Readings from Last 3 Encounters:  04/27/23 228 lb 3.2 oz  (103.5 kg)  03/21/23 233 lb (105.7 kg)  04/16/16 220 lb (99.8 kg)    Physical Exam Constitutional:      General: She is not in acute distress.    Appearance: Normal appearance. She is not ill-appearing.  HENT:     Head: Normocephalic.     Mouth/Throat:     Mouth: Mucous membranes are moist.  Cardiovascular:     Rate and Rhythm: Normal rate and regular rhythm.     Pulses: Normal pulses.     Heart sounds: Normal heart sounds.  Pulmonary:     Effort: Pulmonary effort is normal.     Breath sounds: Rales (bibasilar rales) present. No wheezing or rhonchi.  Abdominal:     Palpations: Abdomen is soft.  Neurological:     General: No focal deficit present.     Mental Status: She is alert and oriented to person, place, and time. Mental status is at baseline.       Ancillary Information    Past Medical History:  Diagnosis Date  Cancer (HCC)    skin ca   Interstitial cystitis    Migraines      Family History  Problem Relation Age of Onset   Alzheimer's disease Mother    Pulmonary fibrosis Father    Aneurysm Father    Breast cancer Neg Hx      Past Surgical History:  Procedure Laterality Date   ABDOMINAL HYSTERECTOMY      Social History   Socioeconomic History   Marital status: Married    Spouse name: Not on file   Number of children: Not on file   Years of education: Not on file   Highest education level: Not on file  Occupational History   Not on file  Tobacco Use   Smoking status: Former    Packs/day: 1.25    Years: 50.00    Additional pack years: 0.00    Total pack years: 62.50    Types: Cigarettes    Quit date: 11/2022    Years since quitting: 0.4   Smokeless tobacco: Former  Substance and Sexual Activity   Alcohol use: Not Currently   Drug use: No   Sexual activity: Not on file  Other Topics Concern   Not on file  Social History Narrative   Not on file   Social Determinants of Health   Financial Resource Strain: Not on file  Food  Insecurity: Not on file  Transportation Needs: Not on file  Physical Activity: Not on file  Stress: Not on file  Social Connections: Not on file  Intimate Partner Violence: Not on file     Allergies  Allergen Reactions   Penicillins Itching     CBC No results found for: "WBC", "RBC", "HGB", "HCT", "PLT", "MCV", "MCH", "MCHC", "RDW", "LYMPHSABS", "MONOABS", "EOSABS", "BASOSABS"  Pulmonary Functions Testing Results:    Latest Ref Rng & Units 04/05/2023    1:34 PM  PFT Results  FVC-Pre L 2.53   FVC-Predicted Pre % 77   FVC-Post L 2.84   FVC-Predicted Post % 86   Pre FEV1/FVC % % 87   Post FEV1/FCV % % 59   FEV1-Pre L 2.20   FEV1-Predicted Pre % 88   FEV1-Post L 1.67   DLCO uncorrected ml/min/mmHg 12.23   DLCO UNC% % 58   DLVA Predicted % 68   TLC L 4.83   TLC % Predicted % 90   RV % Predicted % 78     Outpatient Medications Prior to Visit  Medication Sig Dispense Refill   AIMOVIG 140 MG/ML SOAJ SMARTSIG:140 Milligram(s) SUB-Q Once a Month     ALPRAZolam (XANAX) 0.5 MG tablet Take 0.5 mg by mouth at bedtime as needed.     Ascorbic Acid (VITAMIN C PO) Take 1 tablet by mouth daily.     aspirin 81 MG tablet Take 81 mg by mouth daily.     MAGNESIUM PO Take 1,000 mg by mouth daily.     metoprolol succinate (TOPROL-XL) 100 MG 24 hr tablet Take 100 mg by mouth daily.     Multiple Vitamin (MULTIVITAMIN) tablet Take 1 tablet by mouth daily.     nortriptyline (PAMELOR) 50 MG capsule Take 100 mg by mouth daily.     NURTEC 75 MG TBDP Take 1 tablet by mouth as directed.     omeprazole (PRILOSEC) 40 MG capsule Take by mouth.     oxyCODONE-acetaminophen (PERCOCET/ROXICET) 5-325 MG tablet Take by mouth.     pentosan polysulfate (ELMIRON) 100 MG capsule Take 100 mg by mouth  3 (three) times daily.     Venlafaxine HCl 225 MG TB24 Take 225 mg by mouth daily.     VITAMIN D, CHOLECALCIFEROL, PO Take 1 tablet by mouth daily.     No facility-administered medications prior to visit.

## 2023-04-30 ENCOUNTER — Telehealth: Payer: Self-pay | Admitting: Student in an Organized Health Care Education/Training Program

## 2023-04-30 NOTE — Telephone Encounter (Addendum)
Synetta Fail, please send to [facility]? Patient is aware that referral will be sent to [facility].

## 2023-04-30 NOTE — Telephone Encounter (Signed)
Pt prefers [facility] for the biopsy however is wiling to do whatever makes communication easier for Dr. Aundria Rud.

## 2023-05-01 NOTE — Telephone Encounter (Signed)
I have faxed the referral and notes for [facility] Fax # [phone]

## 2023-05-09 ENCOUNTER — Encounter: Payer: Self-pay | Admitting: Student in an Organized Health Care Education/Training Program

## 2023-05-10 ENCOUNTER — Encounter: Payer: Medicare HMO | Admitting: Thoracic Surgery (Cardiothoracic Vascular Surgery)

## 2023-06-12 IMAGING — US US BREAST*L* LIMITED INC AXILLA
1 series · 12 of 12 positions shown · non-contrast
Comparison: Previous exam(s).

CLINICAL DATA: Screening recall for a possible left breast mass.

EXAM:
DIGITAL DIAGNOSTIC UNILATERAL LEFT MAMMOGRAM WITH TOMOSYNTHESIS AND
CAD; ULTRASOUND LEFT BREAST LIMITED
TECHNIQUE: Left digital diagnostic mammography and breast tomosynthesis was
performed. The images were evaluated with computer-aided detection.;
Targeted ultrasound examination of the left breast was performed.

[Series 1: us breast*left* limited inc axilla · 0.06mm/px · 12 of 12 slices shown]
[im 1/12]
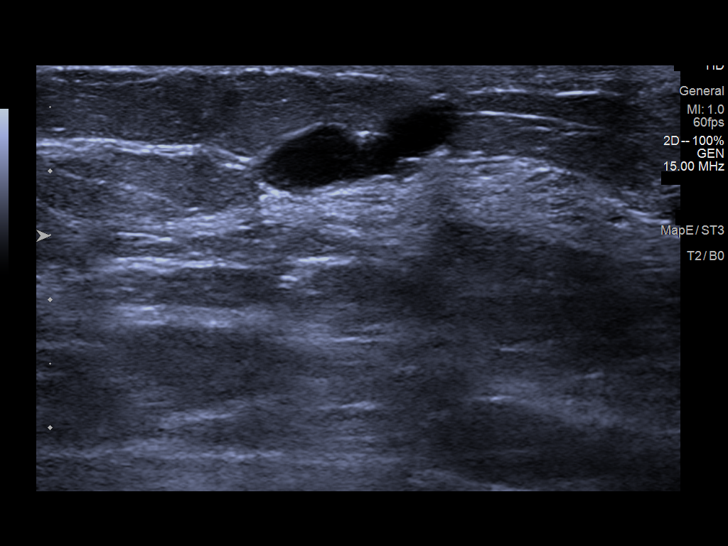
[im 2/12]
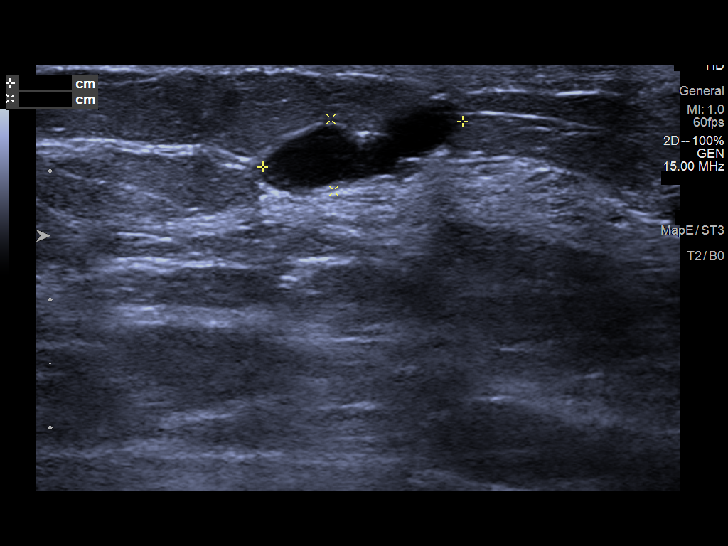
[im 3/12]
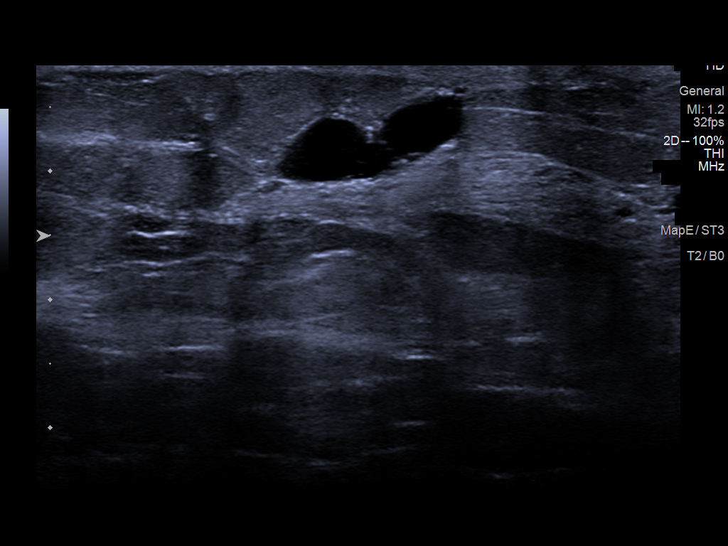
[im 4/12]
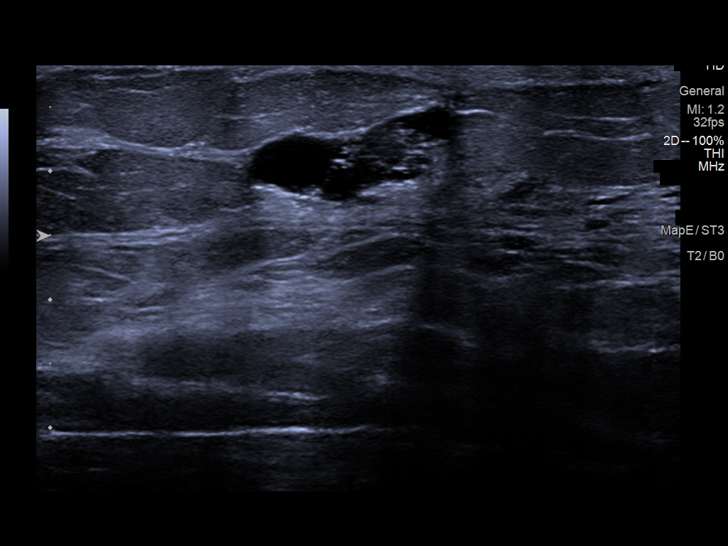
[im 5/12]
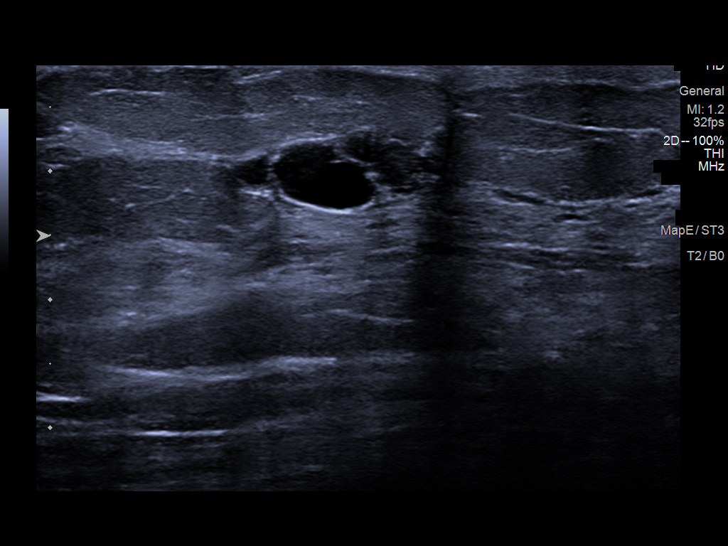
[im 6/12]
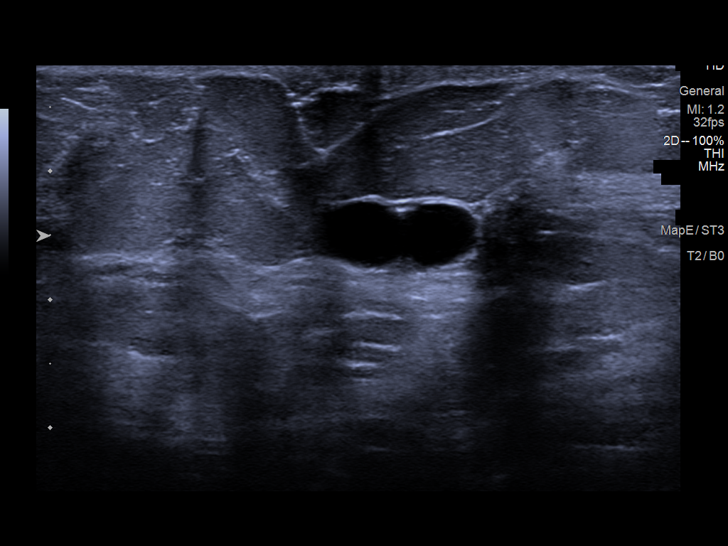
[im 7/12]
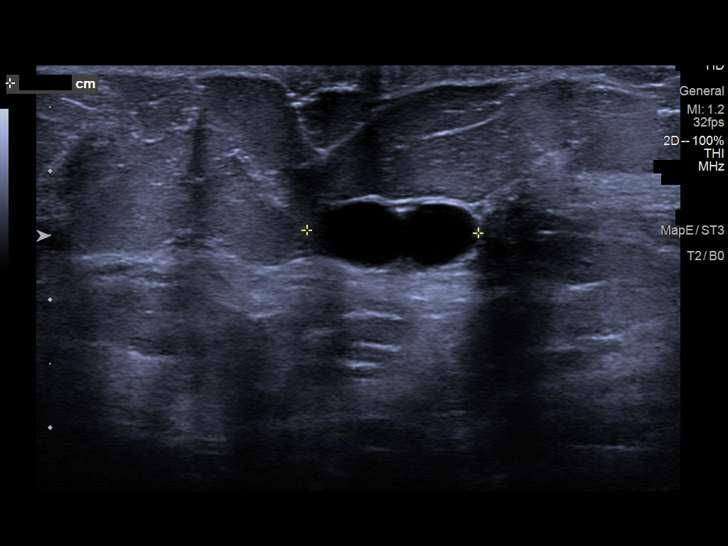
[im 8/12]
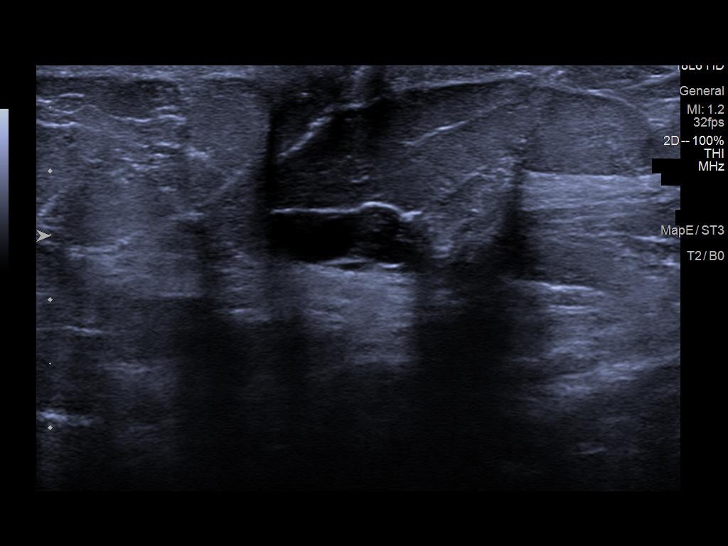
[im 9/12]
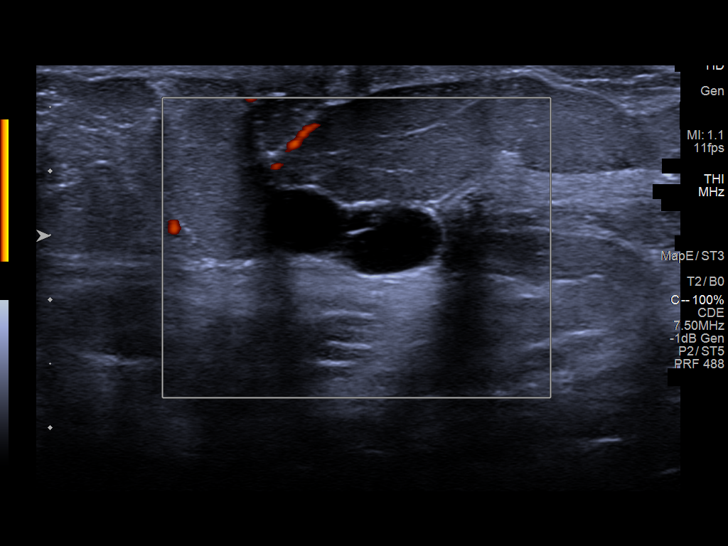
[im 10/12]
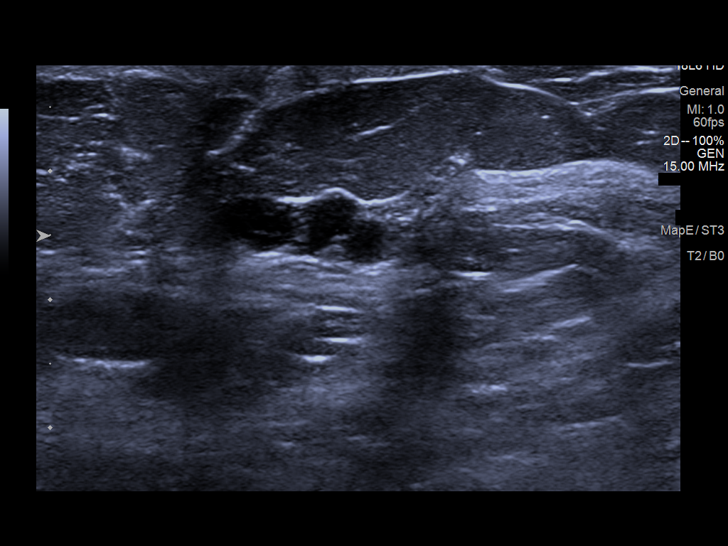
[im 11/12]
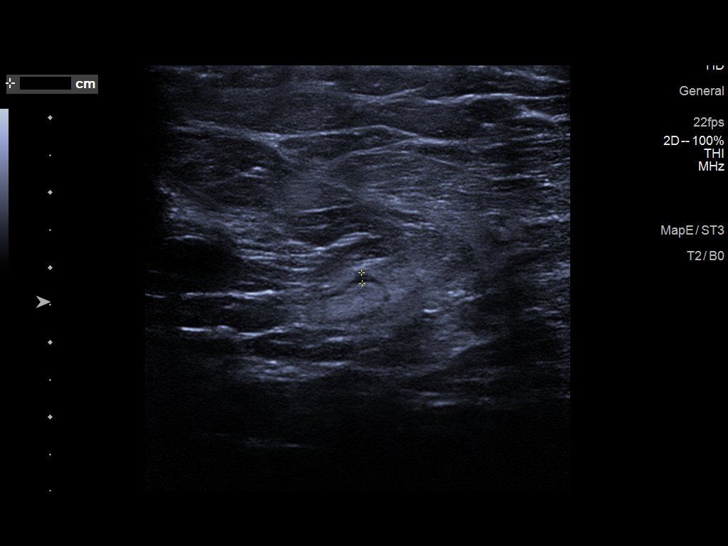
[im 12/12]
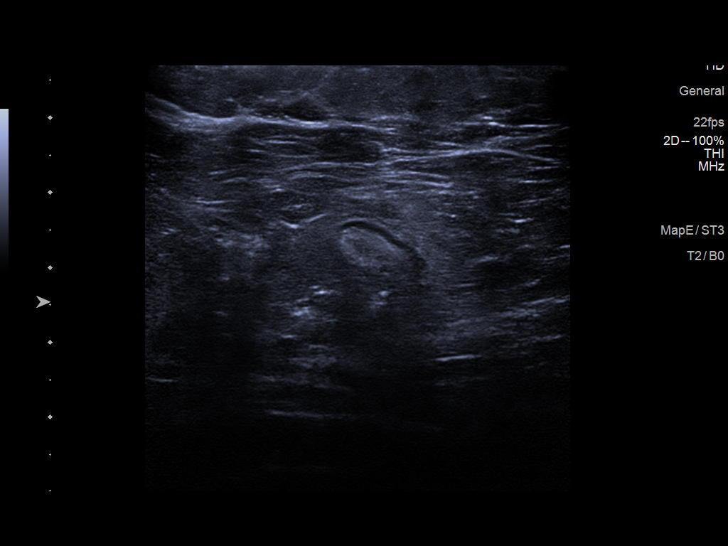

[12 of 12 positions shown; findings below may reference images not displayed]

ACR Breast Density Category b: There are scattered areas of
fibroglandular density.
FINDINGS: Spot compression tomosynthesis images through the medial left breast
demonstrates a circumscribed oval lobulated mass measuring
approximately 2.0 cm.

Ultrasound targeted to the retroareolar left breast at 9 o'clock
demonstrates a benign-appearing anechoic cluster of cysts measuring
1.6 x 0.6 x 1.3 cm. No abnormal lymph nodes are found in the left
axilla.
IMPRESSION: The mass in the retroareolar left breast corresponds with a benign
cluster of cysts.

RECOMMENDATION:
Screening mammogram in one year.(Code:[id number]-[id number])

I have discussed the findings and recommendations with the patient.
If applicable, a reminder letter will be sent to the patient
regarding the next appointment.

BI-RADS CATEGORY  2: Benign.

## 2023-06-12 IMAGING — MG MM DIGITAL DIAGNOSTIC UNILAT*L* W/ TOMO W/ CAD
4 series · 4 of 12 positions shown · non-contrast
Comparison: Previous exam(s).

CLINICAL DATA: Screening recall for a possible left breast mass.

EXAM:
DIGITAL DIAGNOSTIC UNILATERAL LEFT MAMMOGRAM WITH TOMOSYNTHESIS AND
CAD; ULTRASOUND LEFT BREAST LIMITED
TECHNIQUE: Left digital diagnostic mammography and breast tomosynthesis was
performed. The images were evaluated with computer-aided detection.;
Targeted ultrasound examination of the left breast was performed.

[L MLO synth-2D]
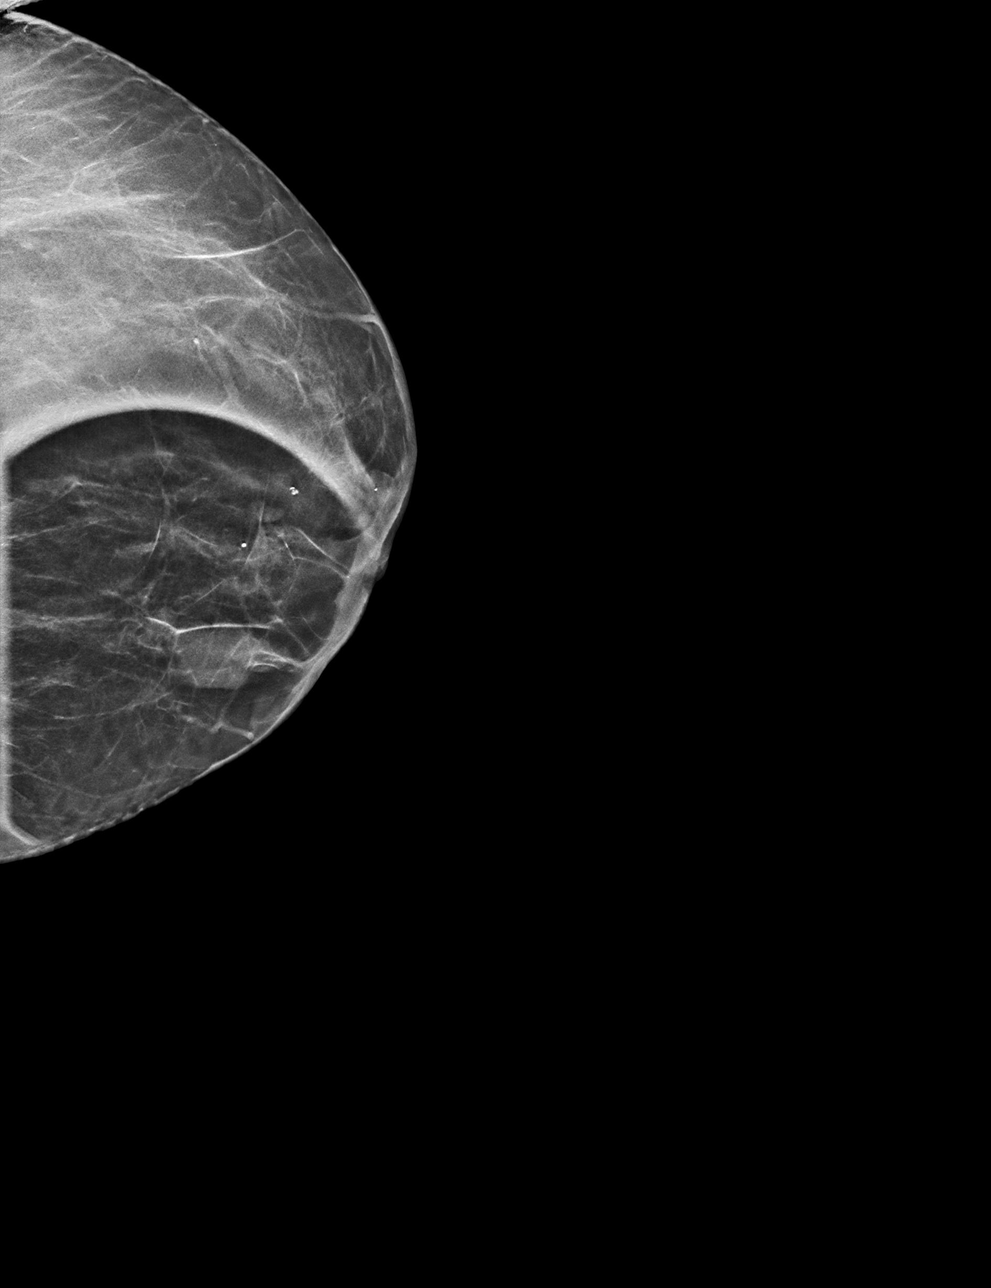

[L CC synth-2D]
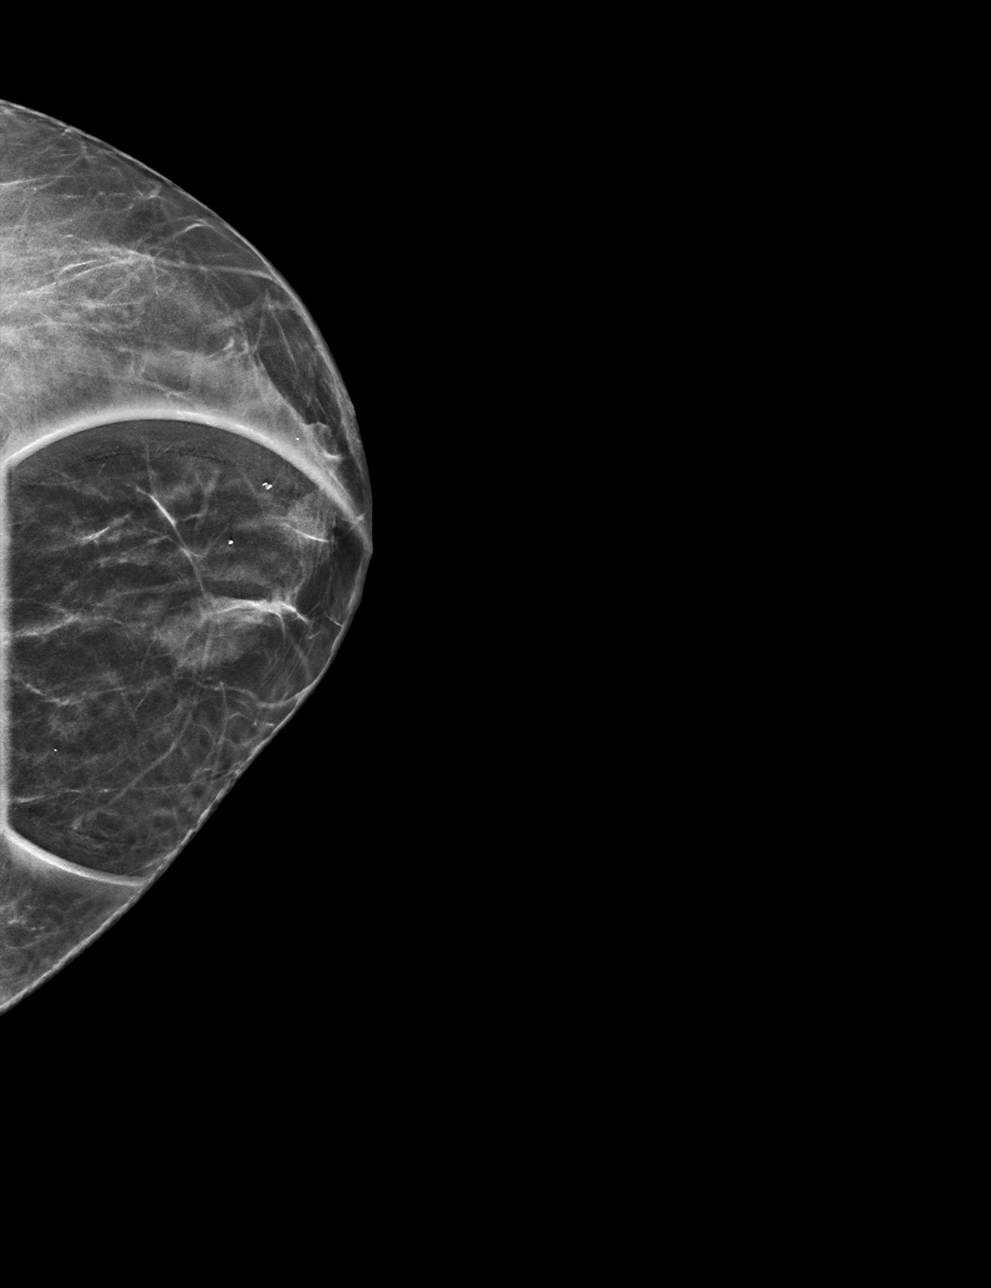

[L MLO tomo · tomo slice 21/42.0]
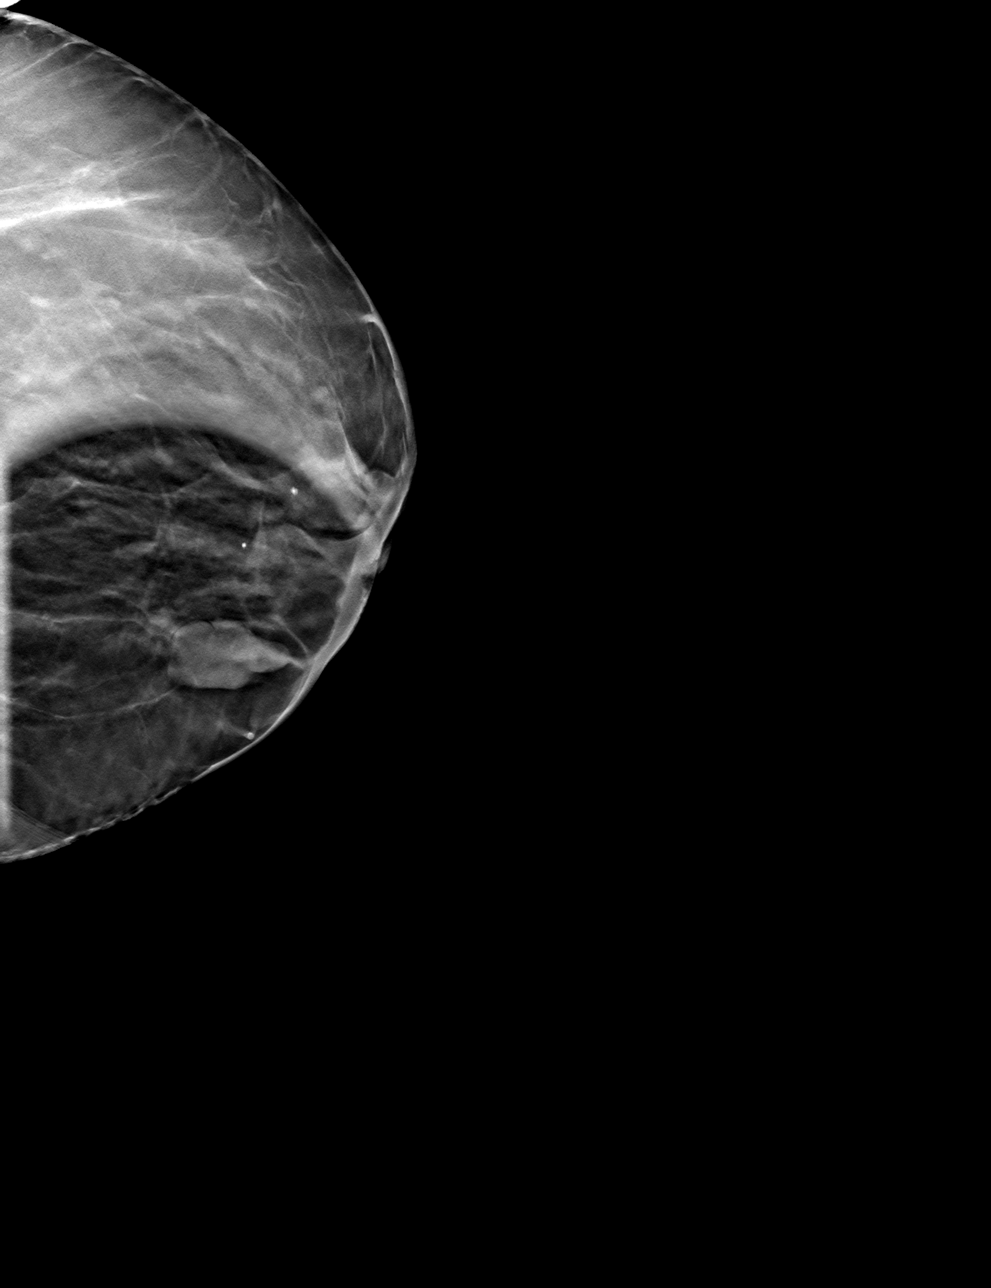

[L CC tomo · tomo slice 20/39.0]
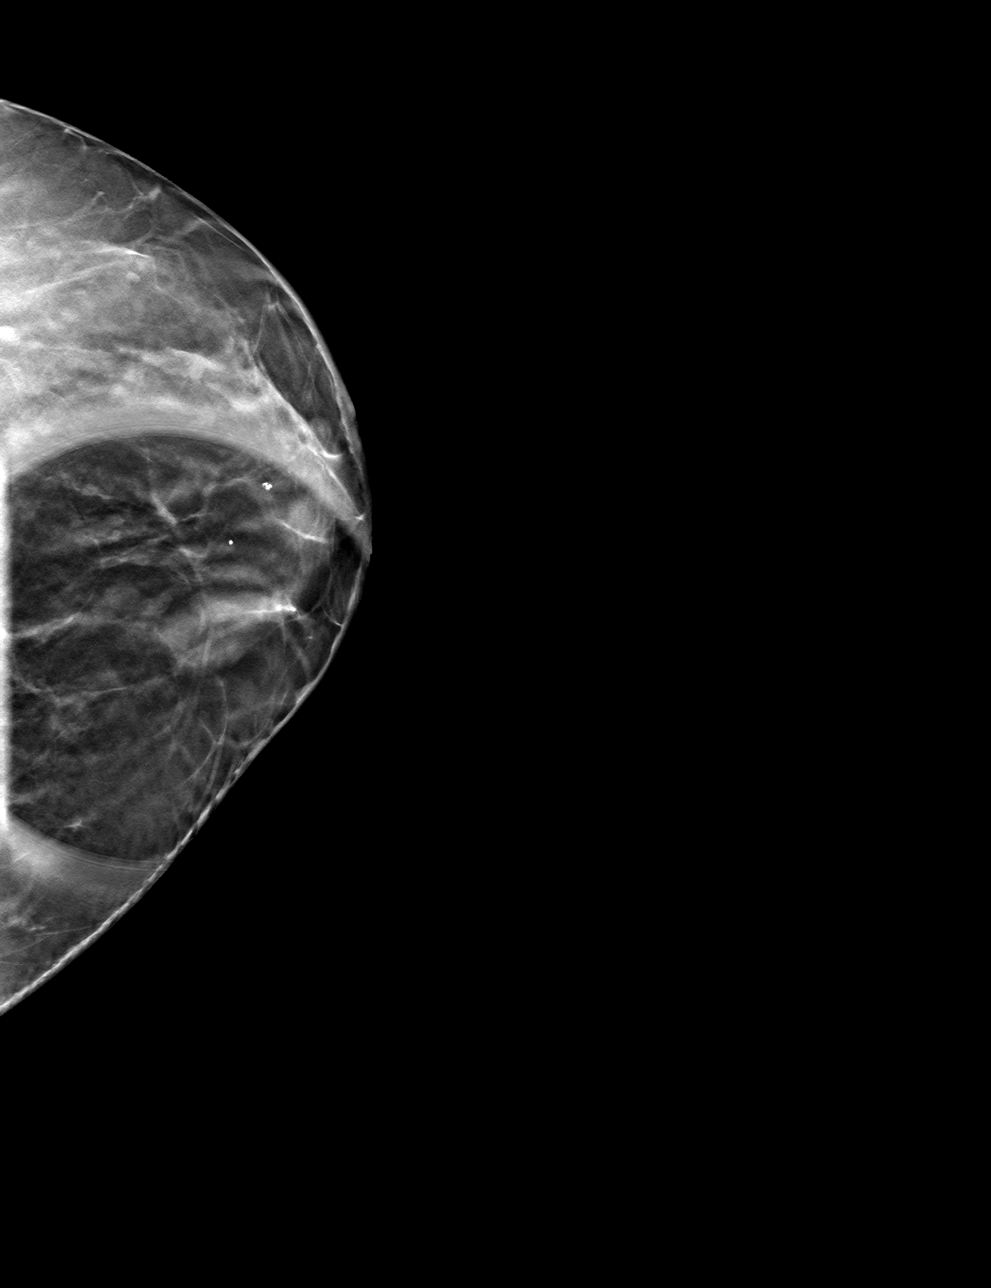

[4 of 12 positions shown; findings below may reference images not displayed]

ACR Breast Density Category b: There are scattered areas of
fibroglandular density.
FINDINGS: Spot compression tomosynthesis images through the medial left breast
demonstrates a circumscribed oval lobulated mass measuring
approximately 2.0 cm.

Ultrasound targeted to the retroareolar left breast at 9 o'clock
demonstrates a benign-appearing anechoic cluster of cysts measuring
1.6 x 0.6 x 1.3 cm. No abnormal lymph nodes are found in the left
axilla.
IMPRESSION: The mass in the retroareolar left breast corresponds with a benign
cluster of cysts.

RECOMMENDATION:
Screening mammogram in one year.(Code:[id number])

I have discussed the findings and recommendations with the patient.
If applicable, a reminder letter will be sent to the patient
regarding the next appointment.

BI-RADS CATEGORY  2: Benign.

## 2023-08-21 ENCOUNTER — Ambulatory Visit: Payer: Medicare HMO | Admitting: Student in an Organized Health Care Education/Training Program

## 2023-09-22 ENCOUNTER — Ambulatory Visit
Admission: EM | Admit: 2023-09-22 | Discharge: 2023-09-22 | Disposition: A | Discharge status: Home / Self Care | Payer: Medicare HMO

## 2023-09-22 ENCOUNTER — Encounter: Payer: Self-pay | Admitting: Emergency Medicine

## 2023-09-22 DIAGNOSIS — B001 Herpesviral vesicular dermatitis: Secondary | ICD-10-CM | POA: Diagnosis not present

## 2023-09-22 MED ORDER — VALACYCLOVIR HCL 1 G PO TABS: 2000.0000 mg | ORAL_TABLET | Freq: Two times a day (BID) | ORAL | 1 refills | Status: AC

## 2023-09-22 MED ORDER — MUPIROCIN 2 % EX OINT: 1.0000 | TOPICAL_OINTMENT | Freq: Two times a day (BID) | CUTANEOUS | 0 refills | Status: AC

## 2023-09-22 NOTE — ED Provider Notes (Signed)
[facility]    CSN: [id number] Arrival date & time: 09/22/23  0805      History   Chief Complaint Chief Complaint  Patient presents with   Oral Swelling   Medication Refill    HPI Tammie Rocha is a 67 y.o. female presenting for painful bumps and swelling of her upper lip that started overnight.  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  States she normally takes Valtrex and Bactrim DS when it gets really bad.  States she wanted to catch things early this time so that does not happen.  No treatments attempted so far.  HPI  Past Medical History:  Diagnosis Date   Cancer (HCC)    skin ca   Interstitial cystitis    Migraines     There are no problems to display for this patient.   Past Surgical History:  Procedure Laterality Date   ABDOMINAL HYSTERECTOMY      OB History   No obstetric history on file.      Home Medications    Prior to Admission medications   Medication Sig Start Date End Date Taking? Authorizing Provider  mupirocin ointment (BACTROBAN) 2 % Apply 1 Application topically 2 (two) times daily. 09/22/23  Yes Shirlee Latch, PA-C  valACYclovir (VALTREX) 1000 MG tablet Take 2 tablets (2,000 mg total) by mouth 2 (two) times daily for 1 day. 09/22/23 09/23/23 Yes Eusebio Friendly B, PA-C  AIMOVIG 140 MG/ML SOAJ SMARTSIG:140 Milligram(s) SUB-Q Once a Month    [provider]  ALPRAZolam Prudy Feeler) 0.5 MG tablet Take 0.5 mg by mouth at bedtime as needed.    [provider]  Ascorbic Acid (VITAMIN C PO) Take 1 tablet by mouth daily.    [provider]  aspirin 81 MG tablet Take 81 mg by mouth daily.    [provider]  EMGALITY 120 MG/ML SOAJ Inject into the skin.    [provider]  MAGNESIUM PO Take 1,000 mg by mouth daily.    [provider]  metoprolol succinate (TOPROL-XL) 100 MG 24 hr tablet Take 100 mg by mouth daily.    [provider]  Multiple  Vitamin (MULTIVITAMIN) tablet Take 1 tablet by mouth daily.    [provider]  nortriptyline (PAMELOR) 50 MG capsule Take 100 mg by mouth daily.    [provider]  NURTEC 75 MG TBDP Take 1 tablet by mouth as directed.    [provider]  omeprazole (PRILOSEC) 40 MG capsule Take by mouth.    [provider]  oxyCODONE-acetaminophen (PERCOCET/ROXICET) 5-325 MG tablet Take by mouth.    [provider]  pentosan polysulfate (ELMIRON) 100 MG capsule Take 100 mg by mouth 3 (three) times daily.    [provider]  UBRELVY 100 MG TABS Take by mouth.    [provider]  Venlafaxine HCl 225 MG TB24 Take 225 mg by mouth daily.    [provider]  VITAMIN D, CHOLECALCIFEROL, PO Take 1 tablet by mouth daily.    [provider]    Family History Family History  Problem Relation Age of Onset   Alzheimer's disease Mother    Pulmonary fibrosis Father    Aneurysm Father    Breast cancer Neg Hx     Social History Social History   Tobacco Use   Smoking status: Former    Current packs/day: 0.00    Average packs/day: 1.3 packs/day for 50.0 years (62.5 ttl  pk-yrs)    Types: Cigarettes    Start date: 11/1972    Quit date: 11/2022    Years since quitting: 0.8   Smokeless tobacco: Former  Building services engineer status: Never Used  Substance Use Topics   Alcohol use: Not Currently   Drug use: No     Allergies   Penicillins   Review of Systems Review of Systems  Constitutional:  Negative for fatigue and fever.  HENT:  Positive for facial swelling and mouth sores. Hearing loss: lip swelling.  Skin:  Positive for color change. Negative for wound.     Physical Exam Triage Vital Signs ED Triage Vitals  Encounter Vitals Group     BP 09/22/23 0814 (!) 163/100     Systolic BP Percentile --      Diastolic BP Percentile --      Pulse Rate 09/22/23 0814 80     Resp 09/22/23 0814 14     Temp 09/22/23 0814 98.5 F  (36.9 C)     Temp Source 09/22/23 0814 Oral     SpO2 09/22/23 0814 96 %     Weight 09/22/23 0812 228 lb 2.8 oz (103.5 kg)     Height 09/22/23 0812 5\' 6"  (1.676 m)     Head Circumference --      Peak Flow --      Pain Score 09/22/23 0812 2     Pain Loc --      Pain Education --      Exclude from Growth Chart --    No data found.  Updated Vital Signs BP (!) 163/100 (BP Location: Left Arm)   Pulse 80   Temp 98.5 F (36.9 C) (Oral)   Resp 14   Ht 5\' 6"  (1.676 m)   Wt 228 lb 2.8 oz (103.5 kg)   SpO2 96%   BMI 36.83 kg/m       Physical Exam Vitals and nursing note reviewed.  Constitutional:      General: She is not in acute distress.    Appearance: Normal appearance. She is not ill-appearing or toxic-appearing.  HENT:     Head: Normocephalic and atraumatic.     Nose: Nose normal.     Mouth/Throat:     Lips: Lesions (multiple tiny erythematous papules/vesicles of upper lip w/ minimal swelling. No drainage/crusting) present.     Mouth: Mucous membranes are moist.     Pharynx: Oropharynx is clear.  Eyes:     General: No scleral icterus.       Right eye: No discharge.        Left eye: No discharge.     Conjunctiva/sclera: Conjunctivae normal.  Cardiovascular:     Rate and Rhythm: Normal rate.  Pulmonary:     Effort: Pulmonary effort is normal. No respiratory distress.  Musculoskeletal:     Cervical back: Neck supple.  Skin:    General: Skin is dry.  Neurological:     General: No focal deficit present.     Mental Status: She is alert. Mental status is at baseline.     Motor: No weakness.     Gait: Gait normal.  Psychiatric:        Mood and Affect: Mood normal.        Behavior: Behavior normal.        Thought Content: Thought content normal.      UC Treatments / Results  Labs (all labs ordered are listed, but only abnormal results are displayed)  Labs Reviewed - No data to display  EKG   Radiology No results found.  Procedures Procedures (including  critical care time)  Medications Ordered in UC Medications - No data to display  Initial Impression / Assessment and Plan / UC Course  I have reviewed the triage vital signs and the nursing notes.  Pertinent labs & imaging results that were available during my care of the patient were reviewed by me and considered in my medical decision making (see chart for details).   67 year old female presents for painful lesions of upper lip that developed overnight.  History of cold sores and secondary cellulitis.  Requests Valtrex and Bactrim DS as that is what she has been provided in the past.  On exam she has multiple tiny papules and vesicles of the upper lip which are consistent with cold sores.  This is not to the level of cellulitis at this time.  Sent Valtrex to pharmacy.  Provided mupirocin ointment to apply to try to prevent secondary infection.  However, advised her if she has worsening swelling, pustular drainage or significant crusting to return for reevaluation and consideration of an antibiotic.   Final Clinical Impressions(s) / UC Diagnoses   Final diagnoses:  Cold sore   Discharge Instructions   None    ED Prescriptions     Medication Sig Dispense Auth. Provider   valACYclovir (VALTREX) 1000 MG tablet Take 2 tablets (2,000 mg total) by mouth 2 (two) times daily for 1 day. 4 tablet Eusebio Friendly B, PA-C   mupirocin ointment (BACTROBAN) 2 % Apply 1 Application topically 2 (two) times daily. 22 g Shirlee Latch, PA-C      PDMP not reviewed this encounter.   Shirlee Latch, PA-C 09/22/23 [phone]

## 2023-09-22 NOTE — ED Triage Notes (Signed)
Patient states that she developed itching and swelling in her upper lip yesterday.  Patient states that she normally takes Valacyclovir but ran out of her medication

## 2023-11-06 DIAGNOSIS — K219 Gastro-esophageal reflux disease without esophagitis: Secondary | ICD-10-CM | POA: Insufficient documentation

## 2023-11-06 DIAGNOSIS — J849 Interstitial pulmonary disease, unspecified: Secondary | ICD-10-CM | POA: Insufficient documentation

## 2023-11-26 ENCOUNTER — Other Ambulatory Visit: Payer: Self-pay | Admitting: Family Medicine

## 2023-11-26 DIAGNOSIS — N644 Mastodynia: Secondary | ICD-10-CM

## 2023-12-13 ENCOUNTER — Ambulatory Visit
Admission: RE | Admit: 2023-12-13 | Discharge: 2023-12-13 | Disposition: A | Discharge status: Home / Self Care | Payer: Medicare HMO | Source: Ambulatory Visit | Attending: Family Medicine | Admitting: Family Medicine

## 2023-12-13 DIAGNOSIS — N644 Mastodynia: Secondary | ICD-10-CM

## 2023-12-25 ENCOUNTER — Ambulatory Visit: Payer: Medicare HMO | Admitting: Physical Therapy

## 2023-12-27 ENCOUNTER — Encounter: Payer: Medicare HMO | Admitting: Physical Therapy

## 2024-01-01 ENCOUNTER — Ambulatory Visit: Payer: Medicare HMO | Attending: Neurology

## 2024-01-01 DIAGNOSIS — R2681 Unsteadiness on feet: Secondary | ICD-10-CM | POA: Insufficient documentation

## 2024-01-01 DIAGNOSIS — M6281 Muscle weakness (generalized): Secondary | ICD-10-CM | POA: Insufficient documentation

## 2024-01-01 NOTE — Therapy (Unsigned)
OUTPATIENT PHYSICAL THERAPY NEURO EVALUATION   Patient Name: Tammie Rocha MRN: [id number] DOB:12/20/1955, 68 y.o., female Today's Date: 01/02/2024   PCP: Joen Laura MD REFERRING PROVIDER: Alexander Mt FNP  END OF SESSION:  PT End of Session - 01/02/24 0849     Visit Number 1    Number of Visits 16    Date for PT Re-Evaluation 02/29/24    Progress Note Due on Visit 10    PT Start Time 1430    PT Stop Time 1515    PT Time Calculation (min) 45 min    Activity Tolerance Patient tolerated treatment well    Behavior During Therapy WFL for tasks assessed/performed             Past Medical History:  Diagnosis Date   Cancer (HCC)    skin ca   Interstitial cystitis    Migraines    Past Surgical History:  Procedure Laterality Date   ABDOMINAL HYSTERECTOMY     There are no active problems to display for this patient.   ONSET DATE: Chronic balance problems but worsened since last one year. REFERRING DIAG:    THERAPY DIAG:  Balance Problems and Muscle weakness  Rationale for Evaluation and Treatment: Rehabilitation  SUBJECTIVE:                                                                                                                                                                                             SUBJECTIVE STATEMENT: Pt reported: " My Dr said I can get better with therapy. I can't drive by myself. I feel wobbly and bouncy when I walk. My body bounces on either side. 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 Its not my doctor's diagnosis.But that's how I feel.  Not every times but sometimes when my headache is on my R and my left feels weak and numb. Sometimes my headache starts from the back of my neck which makes my trigeminal nerve becomes painful. New medication is helping me. I use to drift more to my Right and now it goes either side. I feel weak and fatigued. I don't go out anywhere like before because I am fearful and unable to drive."   Pt  accompanied by: self  PERTINENT HISTORY:  Pt has been under neurologist care for many years due to Migraine headaches and worsening balance problems. 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 Recent MRI from 10/2022 shows Mild aging of  the brain since 2020. No recent insult or specific cause for symptoms.  PAIN:  Are you having pain? No  PRECAUTIONS: Fall  RED FLAGS: None   WEIGHT BEARING RESTRICTIONS: No  FALLS: Has patient fallen in last 6 months? No  LIVING ENVIRONMENT: Lives with: lives with their family Lives in: House/apartment Stairs: Yes: External: 3 steps; none Has following equipment at home: None  PLOF: Independent  PATIENT GOALS: " I want improve my walking without wobbling."  OBJECTIVE:  Note: Objective measures were completed at Evaluation unless otherwise noted.  DIAGNOSTIC FINDINGS:   COGNITION: Overall cognitive status: Within functional limits for tasks assessed   SENSATION: WFL  COORDINATION: L side Nose to Finger coordination mild impairment noted.   MUSCLE TONE: LLE: Within functional limits RLE WFL.   MUSCLE LENGTH: Hamstrings: WFL Thomas test: Single joint muscle tightness noted.  DTRs:  Patella: NT to be completed next session.   POSTURE: rounded shoulders, forward head, and decreased lumbar lordosis  LOWER EXTREMITY ROM:    WFL    LOWER EXTREMITY MMT:  B hip 3/5, B knees 4/5 and B ankles 4/5  GAIT: Gait pattern: step through pattern, uneven step length, mild  sways to Both sides which increases with distraction and head movement causing LOB corrected by Walls or self.  Distance walked: 168ft in gym and hallway Assistive device utilized: None Level of assistance: SBA Comments: Pt is at risk off falling due to balance issues an could  benefit from cane until improvement in Neur motor observed.   FUNCTIONAL TESTS:   PATIENT SURVEYS: 30 seconds chair stand test: 10 reps completed 10 meter walk test: 10 secs with multiple sways on R>L Berg Balance Scale: 45/56 ABC:650/1600= 40% self confident FOTO: 46/53  Simple version of Activities Specific Balance Confidence Scale: 26/45 Headache Disability Index: 76 severe disability                                                                                                                              TREATMENT DATE: 01/01/2024  Mod  COMPLEXITY Eval  Therapeutic Activities: PT discussed findings and condition in depth, goals to establish pt specific POC.    PATIENT EDUCATION: Education details: Significance of compliance to HEP,  Person educated: Patient Education method: Medical illustrator Education comprehension: verbalized understanding and needs further education  HOME EXERCISE PROGRAM: TO be furnished during future sessions.   GOALS: Goals reviewed with patient? Yes  SHORT TERM GOALS: Target date: 01/23/2024  Pt will demosntrate 100% independence and compliance with HEP to improve rehab potential.  Baseline: Needs to establish HEP.  Goal status: INITIAL  LONG TERM GOALS: Target date: 02/29/2024  Pt will improve Headache Disability index score by at least 29 points to demonstrate improved QOL.  Baseline: 76 Goal status: INITIAL  2.  Pt will improve B hip strength to 4/5 to improve BLE functions and balance Baseline: 3/5 Goal status: INITIAL  3.  Pt will complete  at least 15 STS in 30 secs to demonstrate improved LE strength and safety Baseline: 10reps in 30 secs Goal status: INITIAL  4.  Pt will improve ABC score by 500 points to demonstrate improved confidence with functions and decease fall risk.  Baseline: 650 Goal status: INITIAL  5.  Pt will improve Simple version of Activities Specific Balance Confidence Scale to demonstrate improved  confidence with functions and decrease fall risk with mobility. Baseline: 26/45 Goal status: INITIAL  6.  Pt will complete ambulate  2 mins with distraction and Head movement without LOB.  Baseline: Multiple Loss of balance immediately with distraction  Goal status: INITIAL  ASSESSMENT:  CLINICAL IMPRESSION: Patient is a 68 y.o. motivated and pleasant female  who was seen today for physical therapy evaluation and treatment for Balance problems. Pt reports of decreasing coping mechanism of sever headaches. Pt has "hemiplegic headaches" because the the R sided headaches causes L sided Weakness and balance problems which is limiting pt with all functional mobility. 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 Pt's goals is to do daily activities without headache and falling. Neurologist have recently changed pt's headache medications  PT assessment found, impaired LUE coordination, weakness in B hips, frequent headaches, balance problems which worsens with distraction and head movement, unable to do SLS and L sided tandem standing, gait deviations limiting pt with functional mobility and safety confirmed by ABC, Simple version of Activities Specific Balance Confidence Scale, Berg Balance test, Headache disability index score, 64M walk test and 30 sec STS test reps. Pt will benefit from Skilled PT interventions to address impairments and promote safety and balance in order to help pt achieve her goals.  PT communicated with Neurologist regarding C/S involvement.  PS: To complete Spurling, Patellar and Brachial reflex.   OBJECTIVE IMPAIRMENTS: Abnormal gait, decreased balance, and difficulty walking.   ACTIVITY LIMITATIONS: standing, locomotion level, and walking and concentrating  PARTICIPATION LIMITATIONS: cleaning, driving, and community activity, using escalator, reaching for objects on top shelves, ladder climbing, walking in the parking log and getting out of car.   PERSONAL FACTORS: Time  since onset of injury/illness/exacerbation are also affecting patient's functional outcome.   REHAB POTENTIAL: Fair Chronicity and medication dependent headache mangement  CLINICAL DECISION MAKING: Stable/uncomplicated  EVALUATION COMPLEXITY: Moderate  PLAN:  PT FREQUENCY: 1-2x/week  PT DURATION: 8 weeks  PLANNED INTERVENTIONS: 97110-Therapeutic exercises, 97530- Therapeutic activity, [id number]- Neuromuscular re-education, 97535- Self Care, 10272- Manual therapy, and 97116- Gait training  PLAN FOR NEXT SESSION: CANE amb, B hip strengthening, Balance activities, Breathing exs.    Janet Berlin PT DPT 8:56 AM,01/02/24

## 2024-01-02 NOTE — Addendum Note (Signed)
Addended byBlinda Leatherwood, Murice Barbar H on: 01/02/2024 12:20 PM   Modules accepted: Orders

## 2024-01-03 ENCOUNTER — Ambulatory Visit: Payer: Medicare HMO | Admitting: Physical Therapy

## 2024-01-07 NOTE — Therapy (Incomplete)
OUTPATIENT PHYSICAL THERAPY NEURO EVALUATION   Patient Name: Tammie Rocha MRN: [id number] DOB:1956-10-05, 68 y.o., female Today's Date: 01/07/2024   PCP: Joen Laura MD REFERRING PROVIDER: Alexander Mt FNP  END OF SESSION:    Past Medical History:  Diagnosis Date   Cancer (HCC)    skin ca   Interstitial cystitis    Migraines    Past Surgical History:  Procedure Laterality Date   ABDOMINAL HYSTERECTOMY     There are no active problems to display for this patient.   ONSET DATE: Chronic balance problems but worsened since last one year. REFERRING DIAG:    THERAPY DIAG:  Balance Problems and Muscle weakness  Rationale for Evaluation and Treatment: Rehabilitation  SUBJECTIVE:                                                                                                                                                                                             SUBJECTIVE STATEMENT: Pt reported: " My Dr said I can get better with therapy. I can't drive by myself. I feel wobbly and bouncy when I walk. My body bounces on either side. 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 Its not my doctor's diagnosis.But that's how I feel.  Not every times but sometimes when my headache is on my R and my left feels weak and numb. Sometimes my headache starts from the back of my neck which makes my trigeminal nerve becomes painful. New medication is helping me. I use to drift more to my Right and now it goes either side. I feel weak and fatigued. I don't go out anywhere like before because I am fearful and unable to drive."   Pt accompanied by: self  PERTINENT HISTORY:  Pt has been under neurologist care for many years due to Migraine headaches and worsening balance problems. 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 Recent MRI from 10/2022 shows Mild aging of the brain since 2020. No recent insult or specific cause for symptoms.  PAIN:  Are you having pain? No  PRECAUTIONS: Fall  RED FLAGS: None   WEIGHT BEARING RESTRICTIONS: No  FALLS: Has patient fallen in last 6 months? No  LIVING ENVIRONMENT: Lives with: lives with their family Lives in: House/apartment Stairs: Yes: External: 3 steps; none Has following equipment at home: None  PLOF: Independent  PATIENT GOALS: " I want improve my walking without wobbling."  OBJECTIVE:  Note: Objective measures were completed at  Evaluation unless otherwise noted.  DIAGNOSTIC FINDINGS:   COGNITION: Overall cognitive status: Within functional limits for tasks assessed   SENSATION: WFL  COORDINATION: L side Nose to Finger coordination mild impairment noted.   MUSCLE TONE: LLE: Within functional limits RLE WFL.   MUSCLE LENGTH: Hamstrings: WFL Thomas test: Single joint muscle tightness noted.  DTRs:  Patella: NT to be completed next session.   POSTURE: rounded shoulders, forward head, and decreased lumbar lordosis  LOWER EXTREMITY ROM:    WFL    LOWER EXTREMITY MMT:  B hip 3/5, B knees 4/5 and B ankles 4/5  GAIT: Gait pattern: step through pattern, uneven step length, mild  sways to Both sides which increases with distraction and head movement causing LOB corrected by Walls or self.  Distance walked: 173ft in gym and hallway Assistive device utilized: None Level of assistance: SBA Comments: Pt is at risk off falling due to balance issues an could benefit from cane until improvement in Neur motor observed.   FUNCTIONAL TESTS:   PATIENT SURVEYS: 30 seconds chair stand test: 10 reps completed 10 meter walk test: 10 secs with multiple sways on R>L Berg Balance Scale: 45/56 ABC:650/1600= 40% self confident FOTO: 46/53  Simple version of Activities Specific Balance Confidence Scale: 26/45 Headache  Disability Index: 76 severe disability                                                                                                                              TREATMENT DATE: 01/07/24  Subjective: ***    Therapeutic Activities: PT discussed findings and condition in depth, goals to establish pt specific POC.    PATIENT EDUCATION: Education details: Significance of compliance to HEP,  Person educated: Patient Education method: Medical illustrator Education comprehension: verbalized understanding and needs further education  HOME EXERCISE PROGRAM: TO be furnished during future sessions.   GOALS: Goals reviewed with patient? Yes  SHORT TERM GOALS: Target date: 01/23/2024  Pt will demosntrate 100% independence and compliance with HEP to improve rehab potential.  Baseline: Needs to establish HEP.  Goal status: INITIAL  LONG TERM GOALS: Target date: 02/29/2024  Pt will improve Headache Disability index score by at least 29 points to demonstrate improved QOL.  Baseline: 76 Goal status: INITIAL  2.  Pt will improve B hip strength to 4/5 to improve BLE functions and balance Baseline: 3/5 Goal status: INITIAL  3.  Pt will complete at least 15 STS in 30 secs to demonstrate improved LE strength and safety Baseline: 10reps in 30 secs Goal status: INITIAL  4.  Pt will improve ABC score by 500 points to demonstrate improved confidence with functions and decease fall risk.  Baseline: 650 Goal status: INITIAL  5.  Pt will improve Simple version of Activities Specific Balance Confidence Scale to demonstrate improved confidence with functions and decrease fall risk with mobility. Baseline: 26/45 Goal status: INITIAL  6.  Pt will complete ambulate  2 mins  with distraction and Head movement without LOB.  Baseline: Multiple Loss of balance immediately with distraction  Goal status: INITIAL  ASSESSMENT:  CLINICAL IMPRESSION: Patient is a 68 y.o. motivated and pleasant  female  who was seen today for physical therapy evaluation and treatment for Balance problems. Pt reports of decreasing coping mechanism of sever headaches. Pt has "hemiplegic headaches" because the the R sided headaches causes L sided Weakness and balance problems which is limiting pt with all functional mobility. 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 Pt's goals is to do daily activities without headache and falling. Neurologist have recently changed pt's headache medications  PT assessment found, impaired LUE coordination, weakness in B hips, frequent headaches, balance problems which worsens with distraction and head movement, unable to do SLS and L sided tandem standing, gait deviations limiting pt with functional mobility and safety confirmed by ABC, Simple version of Activities Specific Balance Confidence Scale, Berg Balance test, Headache disability index score, 41M walk test and 30 sec STS test reps. Pt will benefit from Skilled PT interventions to address impairments and promote safety and balance in order to help pt achieve her goals.  PT communicated with Neurologist regarding C/S involvement.  PS: To complete Spurling, Patellar and Brachial reflex.   OBJECTIVE IMPAIRMENTS: Abnormal gait, decreased balance, and difficulty walking.   ACTIVITY LIMITATIONS: standing, locomotion level, and walking and concentrating  PARTICIPATION LIMITATIONS: cleaning, driving, and community activity, using escalator, reaching for objects on top shelves, ladder climbing, walking in the parking log and getting out of car.   PERSONAL FACTORS: Time since onset of injury/illness/exacerbation are also affecting patient's functional outcome.   REHAB POTENTIAL: Fair Chronicity and medication dependent headache mangement  CLINICAL DECISION MAKING: Stable/uncomplicated  EVALUATION COMPLEXITY: Moderate  PLAN:  PT FREQUENCY: 1-2x/week  PT DURATION: 8 weeks  PLANNED INTERVENTIONS: 97110-Therapeutic  exercises, 97530- Therapeutic activity, 97112- Neuromuscular re-education, 97535- Self Care, 69629- Manual therapy, and 97116- Gait training  PLAN FOR NEXT SESSION: CANE amb, B hip strengthening, Balance activities,     Maylon Peppers, PT, DPT Physical Therapist - [place]  [facility]  10:19 AM,01/07/24

## 2024-01-08 ENCOUNTER — Ambulatory Visit: Payer: Medicare HMO | Admitting: Physical Therapy

## 2024-01-09 ENCOUNTER — Other Ambulatory Visit: Payer: Self-pay | Admitting: Neurology

## 2024-01-09 DIAGNOSIS — R531 Weakness: Secondary | ICD-10-CM

## 2024-01-09 DIAGNOSIS — M542 Cervicalgia: Secondary | ICD-10-CM

## 2024-01-09 DIAGNOSIS — R2689 Other abnormalities of gait and mobility: Secondary | ICD-10-CM

## 2024-01-09 DIAGNOSIS — G43109 Migraine with aura, not intractable, without status migrainosus: Secondary | ICD-10-CM

## 2024-01-09 NOTE — Therapy (Signed)
OUTPATIENT PHYSICAL THERAPY NEURO TREATMENT   Patient Name: Tammie Rocha MRN: [id number] DOB:[birth year], 68 y.o., female Today's Date: 01/10/2024   PCP: Joen Laura MD REFERRING PROVIDER: Alexander Mt FNP  END OF SESSION:  PT End of Session - 01/10/24 0944     Visit Number 2    Number of Visits 16    Date for PT Re-Evaluation 02/29/24    Progress Note Due on Visit 10    PT Start Time 0945    PT Stop Time 1027    PT Time Calculation (min) 42 min    Activity Tolerance Patient tolerated treatment well    Behavior During Therapy WFL for tasks assessed/performed              Past Medical History:  Diagnosis Date   Cancer (HCC)    skin ca   Interstitial cystitis    Migraines    Past Surgical History:  Procedure Laterality Date   ABDOMINAL HYSTERECTOMY     There are no active problems to display for this patient.   ONSET DATE: Chronic balance problems but worsened since last one year. REFERRING DIAG:    THERAPY DIAG:  Balance Problems and Muscle weakness  Rationale for Evaluation and Treatment: Rehabilitation  SUBJECTIVE:                                                                                                                                                                                             SUBJECTIVE STATEMENT: Pt reported: " My Dr said I can get better with therapy. I can't drive by myself. I feel wobbly and bouncy when I walk. My body bounces on either side. 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 Its not my doctor's diagnosis.But that's how I feel.  Not every times but sometimes when my headache is on my R and my left feels weak and numb. Sometimes my headache starts from the back of my neck which makes my trigeminal nerve becomes painful. New medication is helping me. I use to drift more to my Right and now it goes either side. I feel weak and fatigued. I don't go out anywhere like before because I am fearful and unable to drive."   Pt  accompanied by: self  PERTINENT HISTORY:  Pt has been under neurologist care for many years due to Migraine headaches and worsening balance problems. 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 Recent MRI from 10/2022 shows Mild aging  of the brain since 2020. No recent insult or specific cause for symptoms.  PAIN:  Are you having pain? No  PRECAUTIONS: Fall  RED FLAGS: None   WEIGHT BEARING RESTRICTIONS: No  FALLS: Has patient fallen in last 6 months? No  LIVING ENVIRONMENT: Lives with: lives with their family Lives in: House/apartment Stairs: Yes: External: 3 steps; none Has following equipment at home: None  PLOF: Independent  PATIENT GOALS: " I want improve my walking without wobbling."  OBJECTIVE:  Note: Objective measures were completed at Evaluation unless otherwise noted.  DIAGNOSTIC FINDINGS:   COGNITION: Overall cognitive status: Within functional limits for tasks assessed   SENSATION: WFL  COORDINATION: L side Nose to Finger coordination mild impairment noted.   MUSCLE TONE: LLE: Within functional limits RLE WFL.   MUSCLE LENGTH: Hamstrings: WFL Thomas test: Single joint muscle tightness noted.  DTRs:  Patella: NT to be completed next session.   POSTURE: rounded shoulders, forward head, and decreased lumbar lordosis  LOWER EXTREMITY ROM:    WFL    LOWER EXTREMITY MMT:  B hip 3/5, B knees 4/5 and B ankles 4/5  GAIT: Gait pattern: step through pattern, uneven step length, mild  sways to Both sides which increases with distraction and head movement causing LOB corrected by Walls or self.  Distance walked: 132ft in gym and hallway Assistive device utilized: None Level of assistance: SBA Comments: Pt is at risk off falling due to balance issues an could  benefit from cane until improvement in Neur motor observed.   FUNCTIONAL TESTS:   PATIENT SURVEYS: 30 seconds chair stand test: 10 reps completed 10 meter walk test: 10 secs with multiple sways on R>L Berg Balance Scale: 45/56 ABC:650/1600= 40% self confident FOTO: 46/53  Simple version of Activities Specific Balance Confidence Scale: 26/45 Headache Disability Index: 76 severe disability                                                                                                                              TREATMENT DATE: 01/10/24  Subjective: Patient reports that she talked to her NP at Dr. Margaretmary Eddy office and they are going to get an MRI of the C-spine. Not scheduled yet. Denies falls since last session.    Therapeutic Exercise - improved strength as needed to improve performance of CKC activities/functional movements and as needed for power production to prevent fall during episode of large postural perturbation  Nustep level 1 x 8 minutes to cardiorespiratory endurance and muscle strength  Sit to stand 2 x 10  Standing marching 2 x 10 each LE with B UE support Standing hip abduction 2 x 10 each LE with B UE support Standing hip extension 2 x 10 each LE with B UE support Standing heel/toe raises 2 x 10 each LE with B UE support  PATIENT EDUCATION: Education details: Significance of compliance to HEP,  Person educated: Patient Education method: Explanation and Demonstration Education comprehension: verbalized understanding  and needs further education  HOME EXERCISE PROGRAM: Access Code: [facility] URL: [email or web address] Date: 01/10/2024 Prepared by: Maylon Peppers  Exercises - Sit to Stand with Arms Crossed  - 2-3 x daily - 5-7 x weekly - 3 sets - 10 reps - Standing March with Counter Support  - 2-3 x daily - 5-7 x weekly - 3 sets - 10 reps - Standing Hip Abduction with Counter Support  - 2-3 x daily - 5-7 x weekly - 3 sets - 10 reps - Standing Hip  Extension with Counter Support  - 2-3 x daily - 5-7 x weekly - 3 sets - 10 reps - Heel Raises with Counter Support  - 2-3 x daily - 5-7 x weekly - 3 sets - 10 reps   GOALS: Goals reviewed with patient? Yes  SHORT TERM GOALS: Target date: 01/23/2024  Pt will demosntrate 100% independence and compliance with HEP to improve rehab potential.  Baseline: Needs to establish HEP.  Goal status: INITIAL  LONG TERM GOALS: Target date: 02/29/2024  Pt will improve Headache Disability index score by at least 29 points to demonstrate improved QOL.  Baseline: 76 Goal status: INITIAL  2.  Pt will improve B hip strength to 4/5 to improve BLE functions and balance Baseline: 3/5 Goal status: INITIAL  3.  Pt will complete at least 15 STS in 30 secs to demonstrate improved LE strength and safety Baseline: 10reps in 30 secs Goal status: INITIAL  4.  Pt will improve ABC score by 500 points to demonstrate improved confidence with functions and decease fall risk.  Baseline: 650 Goal status: INITIAL  5.  Pt will improve Simple version of Activities Specific Balance Confidence Scale to demonstrate improved confidence with functions and decrease fall risk with mobility. Baseline: 26/45 Goal status: INITIAL  6.  Pt will complete ambulate  2 mins with distraction and Head movement without LOB.  Baseline: Multiple Loss of balance immediately with distraction  Goal status: INITIAL  ASSESSMENT:  CLINICAL IMPRESSION: Patient arrives to treatment session motivated to participate. Session focused on HEP establishment focused on BLE strengthening. Able to demonstrate understanding of all exercises. Handout provided. Patient awaiting call to schedule cervical MRI. Patient will continue to benefit from skilled therapy to address remaining deficits in order to improve quality of life and return to PLOF.    OBJECTIVE IMPAIRMENTS: Abnormal gait, decreased balance, and difficulty walking.   ACTIVITY LIMITATIONS:  standing, locomotion level, and walking and concentrating  PARTICIPATION LIMITATIONS: cleaning, driving, and community activity, using escalator, reaching for objects on top shelves, ladder climbing, walking in the parking log and getting out of car.   PERSONAL FACTORS: Time since onset of injury/illness/exacerbation are also affecting patient's functional outcome.   REHAB POTENTIAL: Fair Chronicity and medication dependent headache mangement  CLINICAL DECISION MAKING: Stable/uncomplicated  EVALUATION COMPLEXITY: Moderate  PLAN:  PT FREQUENCY: 1-2x/week  PT DURATION: 8 weeks  PLANNED INTERVENTIONS: 97110-Therapeutic exercises, 97530- Therapeutic activity, 97112- Neuromuscular re-education, 97535- Self Care, 40981- Manual therapy, and 97116- Gait training  PLAN FOR NEXT SESSION: CANE amb, B hip strengthening, Balance activities,     Maylon Peppers, PT, DPT Physical Therapist -   [facility]  9:44 AM,01/10/24

## 2024-01-10 ENCOUNTER — Encounter: Payer: Self-pay | Admitting: Physical Therapy

## 2024-01-10 ENCOUNTER — Ambulatory Visit: Payer: Medicare HMO | Admitting: Physical Therapy

## 2024-01-10 DIAGNOSIS — R2681 Unsteadiness on feet: Secondary | ICD-10-CM

## 2024-01-10 DIAGNOSIS — M6281 Muscle weakness (generalized): Secondary | ICD-10-CM

## 2024-01-11 ENCOUNTER — Encounter: Payer: Self-pay | Admitting: Neurology

## 2024-01-14 NOTE — Therapy (Signed)
 OUTPATIENT PHYSICAL THERAPY NEURO TREATMENT   Patient Name: Tammie Rocha MRN: [id number] DOB:[birth year], 68 y.o., female Today's Date: 01/15/2024   PCP: LEITA ALDER MD REFERRING PROVIDER: Allyson Stallion FNP  END OF SESSION:  PT End of Session - 01/15/24 0937     Visit Number 3    Number of Visits 16    Date for PT Re-Evaluation 02/29/24    Progress Note Due on Visit 10    PT Start Time 0945    PT Stop Time 1027    PT Time Calculation (min) 42 min    Activity Tolerance Patient tolerated treatment well    Behavior During Therapy WFL for tasks assessed/performed               Past Medical History:  Diagnosis Date   Cancer (HCC)    skin ca   Interstitial cystitis    Migraines    Past Surgical History:  Procedure Laterality Date   ABDOMINAL HYSTERECTOMY     There are no active problems to display for this patient.   ONSET DATE: Chronic balance problems but worsened since last one year. REFERRING DIAG:    THERAPY DIAG:  Balance Problems and Muscle weakness  Rationale for Evaluation and Treatment: Rehabilitation  SUBJECTIVE:                                                                                                                                                                                             SUBJECTIVE STATEMENT: Pt reported:  My Dr said I can get better with therapy. I can't drive by myself. I feel wobbly and bouncy when I walk. My body bounces on either side. 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 Its not my doctor's diagnosis.But that's how I feel.  Not every times but sometimes when my headache is on my R and my left feels weak and numb. Sometimes my headache starts from the back of my neck which makes my trigeminal nerve becomes painful. New medication is helping me. I use to drift more to my Right and now it goes either side. I feel weak and fatigued. I don't go out anywhere like before because I am fearful and unable to drive.   Pt  accompanied by: self  PERTINENT HISTORY:  Pt has been under neurologist care for many years due to Migraine headaches and worsening balance problems. 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 Recent MRI from 10/2022 shows Mild  aging of the brain since 2020. No recent insult or specific cause for symptoms.  PAIN:  Are you having pain? No  PRECAUTIONS: Fall  RED FLAGS: None   WEIGHT BEARING RESTRICTIONS: No  FALLS: Has patient fallen in last 6 months? No  LIVING ENVIRONMENT: Lives with: lives with their family Lives in: House/apartment Stairs: Yes: External: 3 steps; none Has following equipment at home: None  PLOF: Independent  PATIENT GOALS:  I want improve my walking without wobbling.  OBJECTIVE:  Note: Objective measures were completed at Evaluation unless otherwise noted.  DIAGNOSTIC FINDINGS:   COGNITION: Overall cognitive status: Within functional limits for tasks assessed   SENSATION: WFL  COORDINATION: L side Nose to Finger coordination mild impairment noted.   MUSCLE TONE: LLE: Within functional limits RLE WFL.   MUSCLE LENGTH: Hamstrings: WFL Thomas test: Single joint muscle tightness noted.  DTRs:  Patella: NT to be completed next session.   POSTURE: rounded shoulders, forward head, and decreased lumbar lordosis  LOWER EXTREMITY ROM:    WFL    LOWER EXTREMITY MMT:  B hip 3/5, B knees 4/5 and B ankles 4/5  GAIT: Gait pattern: step through pattern, uneven step length, mild  sways to Both sides which increases with distraction and head movement causing LOB corrected by Walls or self.  Distance walked: 195ft in gym and hallway Assistive device utilized: None Level of assistance: SBA Comments: Pt is at risk off falling due to balance issues an could  benefit from cane until improvement in Neur motor observed.   FUNCTIONAL TESTS:   PATIENT SURVEYS: 30 seconds chair stand test: 10 reps completed 10 meter walk test: 10 secs with multiple sways on R>L Berg Balance Scale: 45/56 ABC:650/1600= 40% self confident FOTO: 46/53  Simple version of Activities Specific Balance Confidence Scale: 26/45 Headache Disability Index: 76 severe disability                                                                                                                              TREATMENT DATE: 01/15/24  Subjective: Patient reports she is having a more stumbly day today. Her insurance approved her cervical MRI but not scheduled yet. Difficulty completing her exercises over the weekend due to migraine.   Gait belt donned during all balance activities, CGA provided at all times throughout treatment session. Multiple LOBs but patient able to self recover.   Therapeutic Activity - improved strength as needed to improve performance of CKC activities/functional movements and as needed for power production to prevent fall during episode of large postural perturbation  Nustep level 2 x 8 minutes to cardiorespiratory endurance and muscle strength  Sit to stand 2 x 10 Dynamic marching at blue agility ladder x 4 laps D/B  6 alternating step taps x 10 each LE   Neuromuscular Re-education - for improved sensory integration, static and dynamic postural control, equilibrium and non-equilibrium coordination as needed for negotiating home and community environment and stepping  over obstacles  Standing narrow BOS on airex 2 x 30 seconds Standing semi tandem BOS on airex 2 x 30 seconds leading with each LE  Fwd step ups onto airex 2 x 15 leading with each LE Alternating cone tap 2 x 10 each LE with intermittent UE support with LOB  PATIENT EDUCATION: Education details: Significance of compliance to HEP,  Person educated: Patient Education method: Software Engineer Education comprehension: verbalized understanding and needs further education  HOME EXERCISE PROGRAM: Access Code: [facility] URL: [email or web address] Date: 01/10/2024 Prepared by: Maryanne Finder  Exercises - Sit to Stand with Arms Crossed  - 2-3 x daily - 5-7 x weekly - 3 sets - 10 reps - Standing March with Counter Support  - 2-3 x daily - 5-7 x weekly - 3 sets - 10 reps - Standing Hip Abduction with Counter Support  - 2-3 x daily - 5-7 x weekly - 3 sets - 10 reps - Standing Hip Extension with Counter Support  - 2-3 x daily - 5-7 x weekly - 3 sets - 10 reps - Heel Raises with Counter Support  - 2-3 x daily - 5-7 x weekly - 3 sets - 10 reps   GOALS: Goals reviewed with patient? Yes  SHORT TERM GOALS: Target date: 01/23/2024  Pt will demosntrate 100% independence and compliance with HEP to improve rehab potential.  Baseline: Needs to establish HEP.  Goal status: INITIAL  LONG TERM GOALS: Target date: 02/29/2024  Pt will improve Headache Disability index score by at least 29 points to demonstrate improved QOL.  Baseline: 76 Goal status: INITIAL  2.  Pt will improve B hip strength to 4/5 to improve BLE functions and balance Baseline: 3/5 Goal status: INITIAL  3.  Pt will complete at least 15 STS in 30 secs to demonstrate improved LE strength and safety Baseline: 10reps in 30 secs Goal status: INITIAL  4.  Pt will improve ABC score by 500 points to demonstrate improved confidence with functions and decease fall risk.  Baseline: 650 Goal status: INITIAL  5.  Pt will improve Simple version of Activities Specific Balance Confidence Scale to demonstrate improved confidence with functions and decrease fall risk with mobility. Baseline: 26/45 Goal status: INITIAL  6.  Pt will complete ambulate  2 mins with distraction and Head movement without LOB.  Baseline: Multiple Loss of balance immediately with distraction  Goal status:  INITIAL  ASSESSMENT:  CLINICAL IMPRESSION:  Patient arrives to treatment session with reports of feeling more stumbly this date. Session focused on static and dynamic balance activities. Required CGA during all exercises due to frequent LOBs, however patient able to self recover. Continues to await call to schedule cervical MRI. Patient will continue to benefit from skilled therapy to address remaining deficits in order to improve quality of life and return to PLOF.    OBJECTIVE IMPAIRMENTS: Abnormal gait, decreased balance, and difficulty walking.   ACTIVITY LIMITATIONS: standing, locomotion level, and walking and concentrating  PARTICIPATION LIMITATIONS: cleaning, driving, and community activity, using escalator, reaching for objects on top shelves, ladder climbing, walking in the parking log and getting out of car.   PERSONAL FACTORS: Time since onset of injury/illness/exacerbation are also affecting patient's functional outcome.   REHAB POTENTIAL: Fair Chronicity and medication dependent headache mangement  CLINICAL DECISION MAKING: Stable/uncomplicated  EVALUATION COMPLEXITY: Moderate  PLAN:  PT FREQUENCY: 1-2x/week  PT DURATION: 8 weeks  PLANNED INTERVENTIONS: 97110-Therapeutic exercises, 97530- Therapeutic activity, [id number]- Neuromuscular re-education, 97535- Self Care, [id number]- Manual  therapy, and 02883- Gait training  PLAN FOR NEXT SESSION: B hip strengthening, Balance activities,     Maryanne Finder, PT, DPT Physical Therapist - [place]  [facility]  9:37 AM,01/15/24

## 2024-01-15 ENCOUNTER — Ambulatory Visit: Payer: Medicare HMO | Attending: Neurology

## 2024-01-15 ENCOUNTER — Encounter: Payer: Self-pay | Admitting: Physical Therapy

## 2024-01-15 DIAGNOSIS — M6281 Muscle weakness (generalized): Secondary | ICD-10-CM | POA: Insufficient documentation

## 2024-01-15 DIAGNOSIS — R2681 Unsteadiness on feet: Secondary | ICD-10-CM | POA: Insufficient documentation

## 2024-01-17 ENCOUNTER — Ambulatory Visit: Payer: Medicare HMO | Admitting: Physical Therapy

## 2024-01-22 ENCOUNTER — Encounter: Payer: Self-pay | Admitting: Physical Therapy

## 2024-01-22 ENCOUNTER — Ambulatory Visit: Payer: Medicare HMO

## 2024-01-22 DIAGNOSIS — M6281 Muscle weakness (generalized): Secondary | ICD-10-CM

## 2024-01-22 DIAGNOSIS — R2681 Unsteadiness on feet: Secondary | ICD-10-CM

## 2024-01-22 NOTE — Therapy (Signed)
OUTPATIENT PHYSICAL THERAPY NEURO TREATMENT   Patient Name: Tammie Rocha MRN: [id number] DOB:March 30, 1956, 68 y.o., female Today's Date: 01/22/2024   PCP: Joen Laura MD REFERRING PROVIDER: Alexander Mt FNP  END OF SESSION:  PT End of Session - 01/22/24 0944     Visit Number 4    Number of Visits 16    Date for PT Re-Evaluation 02/29/24    Progress Note Due on Visit 10    PT Start Time 0945    PT Stop Time 1026    PT Time Calculation (min) 41 min    Activity Tolerance Patient tolerated treatment well    Behavior During Therapy WFL for tasks assessed/performed                Past Medical History:  Diagnosis Date   Cancer (HCC)    skin ca   Interstitial cystitis    Migraines    Past Surgical History:  Procedure Laterality Date   ABDOMINAL HYSTERECTOMY     There are no active problems to display for this patient.   ONSET DATE: Chronic balance problems but worsened since last one year. REFERRING DIAG:    THERAPY DIAG:  Balance Problems and Muscle weakness  Rationale for Evaluation and Treatment: Rehabilitation  SUBJECTIVE:                                                                                                                                                                                             SUBJECTIVE STATEMENT: Pt reported: " My Dr said I can get better with therapy. I can't drive by myself. I feel wobbly and bouncy when I walk. My body bounces on either side. 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 Its not my doctor's diagnosis.But that's how I feel.  Not every times but sometimes when my headache is on my R and my left feels weak and numb. Sometimes my headache starts from the back of my neck which makes my trigeminal nerve becomes painful. New medication is helping me. I use to drift more to my Right and now it goes either side. I feel weak and fatigued. I don't go out anywhere like before because I am fearful and unable to drive."   Pt  accompanied by: self  PERTINENT HISTORY:  Pt has been under neurologist care for many years due to Migraine headaches and worsening balance problems. 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 Recent MRI from 10/2022 shows  Mild aging of the brain since 2020. No recent insult or specific cause for symptoms.  PAIN:  Are you having pain? No  PRECAUTIONS: Fall  RED FLAGS: None   WEIGHT BEARING RESTRICTIONS: No  FALLS: Has patient fallen in last 6 months? No  LIVING ENVIRONMENT: Lives with: lives with their family Lives in: House/apartment Stairs: Yes: External: 3 steps; none Has following equipment at home: None  PLOF: Independent  PATIENT GOALS: " I want improve my walking without wobbling."  OBJECTIVE:  Note: Objective measures were completed at Evaluation unless otherwise noted.  DIAGNOSTIC FINDINGS:   COGNITION: Overall cognitive status: Within functional limits for tasks assessed   SENSATION: WFL  COORDINATION: L side Nose to Finger coordination mild impairment noted.   MUSCLE TONE: LLE: Within functional limits RLE WFL.   MUSCLE LENGTH: Hamstrings: WFL Thomas test: Single joint muscle tightness noted.  DTRs:  Patella: NT to be completed next session.   POSTURE: rounded shoulders, forward head, and decreased lumbar lordosis  LOWER EXTREMITY ROM:    WFL    LOWER EXTREMITY MMT:  B hip 3/5, B knees 4/5 and B ankles 4/5  GAIT: Gait pattern: step through pattern, uneven step length, mild  sways to Both sides which increases with distraction and head movement causing LOB corrected by Walls or self.  Distance walked: 174ft in gym and hallway Assistive device utilized: None Level of assistance: SBA Comments: Pt is at risk off falling due to balance issues an could  benefit from cane until improvement in Neur motor observed.   FUNCTIONAL TESTS:   PATIENT SURVEYS: 30 seconds chair stand test: 10 reps completed 10 meter walk test: 10 secs with multiple sways on R>L Berg Balance Scale: 45/56 ABC:650/1600= 40% self confident FOTO: 46/53  Simple version of Activities Specific Balance Confidence Scale: 26/45 Headache Disability Index: 76 severe disability                                                                                                                              TREATMENT DATE: 01/22/24   Subjective: Patient denies any new complaints this date. Having a fairly good day balance wise. Family is starting to feel good after sickness last week.   Gait belt donned during all balance activities, CGA provided at all times throughout treatment session.    Therapeutic Activity - improved strength as needed to improve performance of CKC activities/functional movements and as needed for power production to prevent fall during episode of large postural perturbation  Nustep level 2 x 8 minutes to cardiorespiratory endurance and muscle strength  Sit to stand 2 x 10 with feet on airex  Standing marching with 2# AW 2 x 10  Standing hip abduction with 2# AW 2 x 10  Standing hip extension with 2# AW 2 x 10  6" fwd step ups 2 x 10 with no UE support  6" lateral step ups 2 x 10 with no UE support  Not today:  Neuromuscular Re-education - for improved sensory integration, static and dynamic postural control, equilibrium and non-equilibrium coordination as needed for negotiating home and community environment and stepping over obstacles  Standing narrow BOS on airex 2 x 30 seconds Standing semi tandem BOS on airex 2 x 30 seconds leading with each LE  Fwd step ups onto airex 2 x 15 leading with each LE Alternating cone tap 2 x 10 each LE with intermittent UE support with LOB  PATIENT EDUCATION: Education details: Significance of compliance to HEP,   Person educated: Patient Education method: Medical illustrator Education comprehension: verbalized understanding and needs further education  HOME EXERCISE PROGRAM: Access Code: [facility] URL: [email or web address] Date: 01/10/2024 Prepared by: Maylon Peppers  Exercises - Sit to Stand with Arms Crossed  - 2-3 x daily - 5-7 x weekly - 3 sets - 10 reps - Standing March with Counter Support  - 2-3 x daily - 5-7 x weekly - 3 sets - 10 reps - Standing Hip Abduction with Counter Support  - 2-3 x daily - 5-7 x weekly - 3 sets - 10 reps - Standing Hip Extension with Counter Support  - 2-3 x daily - 5-7 x weekly - 3 sets - 10 reps - Heel Raises with Counter Support  - 2-3 x daily - 5-7 x weekly - 3 sets - 10 reps   GOALS: Goals reviewed with patient? Yes  SHORT TERM GOALS: Target date: 01/23/2024  Pt will demosntrate 100% independence and compliance with HEP to improve rehab potential.  Baseline: Needs to establish HEP.  Goal status: INITIAL  LONG TERM GOALS: Target date: 02/29/2024  Pt will improve Headache Disability index score by at least 29 points to demonstrate improved QOL.  Baseline: 76 Goal status: INITIAL  2.  Pt will improve B hip strength to 4/5 to improve BLE functions and balance Baseline: 3/5 Goal status: INITIAL  3.  Pt will complete at least 15 STS in 30 secs to demonstrate improved LE strength and safety Baseline: 10reps in 30 secs Goal status: INITIAL  4.  Pt will improve ABC score by 500 points to demonstrate improved confidence with functions and decease fall risk.  Baseline: 650 Goal status: INITIAL  5.  Pt will improve Simple version of Activities Specific Balance Confidence Scale to demonstrate improved confidence with functions and decrease fall risk with mobility. Baseline: 26/45 Goal status: INITIAL  6.  Pt will complete ambulate  2 mins with distraction and Head movement without LOB.  Baseline: Multiple Loss of balance  immediately with distraction  Goal status: INITIAL  ASSESSMENT:  CLINICAL IMPRESSION:   Patient arrives to treatment session motivated to participate. Session focused on standing BLE strengthening with increased resistance. Tolerated session well with mild complaints of R LE fatigue with single leg activities. SpO2 monitored throughout activity with spO2 >90% during entirety of session. Patient will continue to benefit from skilled therapy to address remaining deficits in order to improve quality of life and return to PLOF.      OBJECTIVE IMPAIRMENTS: Abnormal gait, decreased balance, and difficulty walking.   ACTIVITY LIMITATIONS: standing, locomotion level, and walking and concentrating  PARTICIPATION LIMITATIONS: cleaning, driving, and community activity, using escalator, reaching for objects on top shelves, ladder climbing, walking in the parking log and getting out of car.   PERSONAL FACTORS: Time since onset of injury/illness/exacerbation are also affecting patient's functional outcome.   REHAB POTENTIAL: Fair Chronicity and medication dependent headache mangement  CLINICAL DECISION MAKING: Stable/uncomplicated  EVALUATION COMPLEXITY: Moderate  PLAN:  PT FREQUENCY: 1-2x/week  PT DURATION: 8 weeks  PLANNED INTERVENTIONS: 97110-Therapeutic exercises, 97530- Therapeutic activity, 97112- Neuromuscular re-education, 97535- Self Care, 16109- Manual therapy, and 97116- Gait training  PLAN FOR NEXT SESSION: B hip strengthening, Balance activities,     Maylon Peppers, PT, DPT Physical Therapist - [place]  [facility]  9:45 AM,01/22/24

## 2024-01-24 ENCOUNTER — Encounter: Payer: Medicare HMO | Admitting: Physical Therapy

## 2024-01-28 NOTE — Therapy (Incomplete)
OUTPATIENT PHYSICAL THERAPY NEURO TREATMENT   Patient Name: Tammie Rocha MRN: [id number] DOB:1956/10/02, 68 y.o., female Today's Date: 01/28/2024   PCP: Joen Laura MD REFERRING PROVIDER: Alexander Mt FNP  END OF SESSION:       Past Medical History:  Diagnosis Date   Cancer (HCC)    skin ca   Interstitial cystitis    Migraines    Past Surgical History:  Procedure Laterality Date   ABDOMINAL HYSTERECTOMY     There are no active problems to display for this patient.   ONSET DATE: Chronic balance problems but worsened since last one year. REFERRING DIAG:    THERAPY DIAG:  Balance Problems and Muscle weakness  Rationale for Evaluation and Treatment: Rehabilitation  SUBJECTIVE:                                                                                                                                                                                             SUBJECTIVE STATEMENT: Pt reported: " My Dr said I can get better with therapy. I can't drive by myself. I feel wobbly and bouncy when I walk. My body bounces on either side. 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 Its not my doctor's diagnosis.But that's how I feel.  Not every times but sometimes when my headache is on my R and my left feels weak and numb. Sometimes my headache starts from the back of my neck which makes my trigeminal nerve becomes painful. New medication is helping me. I use to drift more to my Right and now it goes either side. I feel weak and fatigued. I don't go out anywhere like before because I am fearful and unable to drive."   Pt accompanied by: self  PERTINENT HISTORY:  Pt has been under neurologist care for many years due to Migraine headaches and worsening balance problems. 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 Recent MRI from 10/2022 shows Mild aging of the brain since 2020. No recent insult or specific cause for symptoms.  PAIN:  Are you having pain? No  PRECAUTIONS: Fall  RED FLAGS: None   WEIGHT BEARING RESTRICTIONS: No  FALLS: Has patient fallen in last 6 months? No  LIVING ENVIRONMENT: Lives with: lives with their family Lives in: House/apartment Stairs: Yes: External: 3 steps; none Has following equipment at home: None  PLOF: Independent  PATIENT GOALS: " I want improve my walking without wobbling."  OBJECTIVE:  Note: Objective measures  were completed at Evaluation unless otherwise noted.  DIAGNOSTIC FINDINGS:   COGNITION: Overall cognitive status: Within functional limits for tasks assessed   SENSATION: WFL  COORDINATION: L side Nose to Finger coordination mild impairment noted.   MUSCLE TONE: LLE: Within functional limits RLE WFL.   MUSCLE LENGTH: Hamstrings: WFL Thomas test: Single joint muscle tightness noted.  DTRs:  Patella: NT to be completed next session.   POSTURE: rounded shoulders, forward head, and decreased lumbar lordosis  LOWER EXTREMITY ROM:    WFL    LOWER EXTREMITY MMT:  B hip 3/5, B knees 4/5 and B ankles 4/5  GAIT: Gait pattern: step through pattern, uneven step length, mild  sways to Both sides which increases with distraction and head movement causing LOB corrected by Walls or self.  Distance walked: 149ft in gym and hallway Assistive device utilized: None Level of assistance: SBA Comments: Pt is at risk off falling due to balance issues an could benefit from cane until improvement in Neur motor observed.   FUNCTIONAL TESTS:   PATIENT SURVEYS: 30 seconds chair stand test: 10 reps completed 10 meter walk test: 10 secs with multiple sways on R>L Berg Balance Scale: 45/56 ABC:650/1600= 40% self confident FOTO: 46/53  Simple version of Activities Specific Balance Confidence Scale: 26/45 Headache  Disability Index: 76 severe disability                                                                                                                              TREATMENT DATE: 01/28/24  ***  Subjective: Patient denies any new complaints this date. Having a fairly good day balance wise. Family is starting to feel good after sickness last week.   Gait belt donned during all balance activities, CGA provided at all times throughout treatment session.    Therapeutic Activity - improved strength as needed to improve performance of CKC activities/functional movements and as needed for power production to prevent fall during episode of large postural perturbation  Nustep level 2 x 8 minutes to cardiorespiratory endurance and muscle strength  Sit to stand 2 x 10 with feet on airex  Standing marching with 2# AW 2 x 10  Standing hip abduction with 2# AW 2 x 10  Standing hip extension with 2# AW 2 x 10  6" fwd step ups 2 x 10 with no UE support  6" lateral step ups 2 x 10 with no UE support   Not today:  Neuromuscular Re-education - for improved sensory integration, static and dynamic postural control, equilibrium and non-equilibrium coordination as needed for negotiating home and community environment and stepping over obstacles  Standing narrow BOS on airex 2 x 30 seconds Standing semi tandem BOS on airex 2 x 30 seconds leading with each LE  Fwd step ups onto airex 2 x 15 leading with each LE Alternating cone tap 2 x 10 each LE with intermittent UE support with LOB  PATIENT EDUCATION: Education details: Significance  of compliance to HEP,  Person educated: Patient Education method: Medical illustrator Education comprehension: verbalized understanding and needs further education  HOME EXERCISE PROGRAM: Access Code: [facility] URL: [email or web address] Date: 01/10/2024 Prepared by: Maylon Peppers  Exercises - Sit to Stand with Arms Crossed  - 2-3 x daily - 5-7 x  weekly - 3 sets - 10 reps - Standing March with Counter Support  - 2-3 x daily - 5-7 x weekly - 3 sets - 10 reps - Standing Hip Abduction with Counter Support  - 2-3 x daily - 5-7 x weekly - 3 sets - 10 reps - Standing Hip Extension with Counter Support  - 2-3 x daily - 5-7 x weekly - 3 sets - 10 reps - Heel Raises with Counter Support  - 2-3 x daily - 5-7 x weekly - 3 sets - 10 reps   GOALS: Goals reviewed with patient? Yes  SHORT TERM GOALS: Target date: 01/23/2024  Pt will demosntrate 100% independence and compliance with HEP to improve rehab potential.  Baseline: Needs to establish HEP.  Goal status: INITIAL  LONG TERM GOALS: Target date: 02/29/2024  Pt will improve Headache Disability index score by at least 29 points to demonstrate improved QOL.  Baseline: 76 Goal status: INITIAL  2.  Pt will improve B hip strength to 4/5 to improve BLE functions and balance Baseline: 3/5 Goal status: INITIAL  3.  Pt will complete at least 15 STS in 30 secs to demonstrate improved LE strength and safety Baseline: 10reps in 30 secs Goal status: INITIAL  4.  Pt will improve ABC score by 500 points to demonstrate improved confidence with functions and decease fall risk.  Baseline: 650 Goal status: INITIAL  5.  Pt will improve Simple version of Activities Specific Balance Confidence Scale to demonstrate improved confidence with functions and decrease fall risk with mobility. Baseline: 26/45 Goal status: INITIAL  6.  Pt will complete ambulate  2 mins with distraction and Head movement without LOB.  Baseline: Multiple Loss of balance immediately with distraction  Goal status: INITIAL  ASSESSMENT:  CLINICAL IMPRESSION:  *** Patient arrives to treatment session motivated to participate. Session focused on standing BLE strengthening with increased resistance. Tolerated session well with mild complaints of R LE fatigue with single leg activities. SpO2 monitored throughout activity with spO2 >90%  during entirety of session. Patient will continue to benefit from skilled therapy to address remaining deficits in order to improve quality of life and return to PLOF.      OBJECTIVE IMPAIRMENTS: Abnormal gait, decreased balance, and difficulty walking.   ACTIVITY LIMITATIONS: standing, locomotion level, and walking and concentrating  PARTICIPATION LIMITATIONS: cleaning, driving, and community activity, using escalator, reaching for objects on top shelves, ladder climbing, walking in the parking log and getting out of car.   PERSONAL FACTORS: Time since onset of injury/illness/exacerbation are also affecting patient's functional outcome.   REHAB POTENTIAL: Fair Chronicity and medication dependent headache mangement  CLINICAL DECISION MAKING: Stable/uncomplicated  EVALUATION COMPLEXITY: Moderate  PLAN:  PT FREQUENCY: 1-2x/week  PT DURATION: 8 weeks  PLANNED INTERVENTIONS: 97110-Therapeutic exercises, 97530- Therapeutic activity, 97112- Neuromuscular re-education, 97535- Self Care, 16109- Manual therapy, and 97116- Gait training  PLAN FOR NEXT SESSION: B hip strengthening, Balance activities,     Maylon Peppers, PT, DPT Physical Therapist - [place]  [facility]  2:03 PM,01/28/24

## 2024-01-29 ENCOUNTER — Ambulatory Visit: Payer: Medicare HMO | Admitting: Physical Therapy

## 2024-01-29 DIAGNOSIS — R2681 Unsteadiness on feet: Secondary | ICD-10-CM

## 2024-01-29 DIAGNOSIS — M6281 Muscle weakness (generalized): Secondary | ICD-10-CM

## 2024-01-31 ENCOUNTER — Ambulatory Visit: Payer: Medicare HMO | Admitting: Physical Therapy

## 2024-02-05 ENCOUNTER — Ambulatory Visit: Payer: Medicare HMO

## 2024-02-05 ENCOUNTER — Encounter: Payer: Self-pay | Admitting: Physical Therapy

## 2024-02-05 DIAGNOSIS — M6281 Muscle weakness (generalized): Secondary | ICD-10-CM | POA: Diagnosis not present

## 2024-02-05 DIAGNOSIS — R2681 Unsteadiness on feet: Secondary | ICD-10-CM

## 2024-02-05 NOTE — Therapy (Signed)
 OUTPATIENT PHYSICAL THERAPY NEURO TREATMENT   Patient Name: Tammie Rocha MRN: [id number] DOB:Oct 19, 1956, 68 y.o., female Today's Date: 02/05/2024   PCP: Joen Laura MD REFERRING PROVIDER: Alexander Mt FNP  END OF SESSION:  PT End of Session - 02/05/24 4098     Visit Number 5    Number of Visits 16    Date for PT Re-Evaluation 02/29/24    Progress Note Due on Visit 10    PT Start Time 0945    PT Stop Time 1026    PT Time Calculation (min) 41 min    Activity Tolerance Patient tolerated treatment well    Behavior During Therapy WFL for tasks assessed/performed                 Past Medical History:  Diagnosis Date   Cancer (HCC)    skin ca   Interstitial cystitis    Migraines    Past Surgical History:  Procedure Laterality Date   ABDOMINAL HYSTERECTOMY     There are no active problems to display for this patient.   ONSET DATE: Chronic balance problems but worsened since last one year. REFERRING DIAG:    THERAPY DIAG:  Balance Problems and Muscle weakness  Rationale for Evaluation and Treatment: Rehabilitation  SUBJECTIVE:                                                                                                                                                                                             SUBJECTIVE STATEMENT: Pt reported: " My Dr said I can get better with therapy. I can't drive by myself. I feel wobbly and bouncy when I walk. My body bounces on either side. 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 Its not my doctor's diagnosis.But that's how I feel.  Not every times but sometimes when my headache is on my R and my left feels weak and numb. Sometimes my headache starts from the back of my neck which makes my trigeminal nerve becomes painful. New medication is helping me. I use to drift more to my Right and now it goes either side. I feel weak and fatigued. I don't go out anywhere like before because I am fearful and unable to drive."   Pt  accompanied by: self  PERTINENT HISTORY:  Pt has been under neurologist care for many years due to Migraine headaches and worsening balance problems. 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 Recent MRI from 10/2022  shows Mild aging of the brain since 2020. No recent insult or specific cause for symptoms.  PAIN:  Are you having pain? No  PRECAUTIONS: Fall  RED FLAGS: None   WEIGHT BEARING RESTRICTIONS: No  FALLS: Has patient fallen in last 6 months? No  LIVING ENVIRONMENT: Lives with: lives with their family Lives in: House/apartment Stairs: Yes: External: 3 steps; none Has following equipment at home: None  PLOF: Independent  PATIENT GOALS: " I want improve my walking without wobbling."  OBJECTIVE:  Note: Objective measures were completed at Evaluation unless otherwise noted.  DIAGNOSTIC FINDINGS:   COGNITION: Overall cognitive status: Within functional limits for tasks assessed   SENSATION: WFL  COORDINATION: L side Nose to Finger coordination mild impairment noted.   MUSCLE TONE: LLE: Within functional limits RLE WFL.   MUSCLE LENGTH: Hamstrings: WFL Thomas test: Single joint muscle tightness noted.  DTRs:  Patella: NT to be completed next session.   POSTURE: rounded shoulders, forward head, and decreased lumbar lordosis  LOWER EXTREMITY ROM:    WFL    LOWER EXTREMITY MMT:  B hip 3/5, B knees 4/5 and B ankles 4/5  GAIT: Gait pattern: step through pattern, uneven step length, mild  sways to Both sides which increases with distraction and head movement causing LOB corrected by Walls or self.  Distance walked: 112ft in gym and hallway Assistive device utilized: None Level of assistance: SBA Comments: Pt is at risk off falling due to balance issues an could  benefit from cane until improvement in Neur motor observed.   FUNCTIONAL TESTS:   PATIENT SURVEYS: 30 seconds chair stand test: 10 reps completed 10 meter walk test: 10 secs with multiple sways on R>L Berg Balance Scale: 45/56 ABC:650/1600= 40% self confident FOTO: 46/53  Simple version of Activities Specific Balance Confidence Scale: 26/45 Headache Disability Index: 76 severe disability                                                                                                                              TREATMENT DATE: 02/05/24    Subjective: Patient reports she had headache yesterday but feeling good this date.  Has MRI scheduled for 02/13/24.  Gait belt donned during all balance activities, CGA provided at all times throughout treatment session.    Therapeutic Activity - improved strength as needed to improve performance of CKC activities/functional movements and as needed for power production to prevent fall during episode of large postural perturbation  Nustep level 3 x 8 minutes to cardiorespiratory endurance and muscle strength  Sit to stand 2 x 10 with feet on airex   6" fwd step ups 2 x 10 with no UE support  6" lateral step ups 2 x 10 with no UE support   Neuromuscular Re-education - for improved sensory integration, static and dynamic postural control, equilibrium and non-equilibrium coordination as needed for negotiating home and community environment and stepping over obstacles  Dynamic high marching at  blue agility ladder x 3 laps D/B with 2# AW  Alternating cone tap 2# AW 2 x 10 each LE with intermittent UE support with LOB  PATIENT EDUCATION: Education details: Significance of compliance to HEP,  Person educated: Patient Education method: Medical illustrator Education comprehension: verbalized understanding and needs further education  HOME EXERCISE PROGRAM: Access Code: [facility] URL: [email or web address] Date: 01/10/2024 Prepared  by: Maylon Peppers  Exercises - Sit to Stand with Arms Crossed  - 2-3 x daily - 5-7 x weekly - 3 sets - 10 reps - Standing March with Counter Support  - 2-3 x daily - 5-7 x weekly - 3 sets - 10 reps - Standing Hip Abduction with Counter Support  - 2-3 x daily - 5-7 x weekly - 3 sets - 10 reps - Standing Hip Extension with Counter Support  - 2-3 x daily - 5-7 x weekly - 3 sets - 10 reps - Heel Raises with Counter Support  - 2-3 x daily - 5-7 x weekly - 3 sets - 10 reps   GOALS: Goals reviewed with patient? Yes  SHORT TERM GOALS: Target date: 01/23/2024  Pt will demosntrate 100% independence and compliance with HEP to improve rehab potential.  Baseline: Needs to establish HEP.  Goal status: INITIAL  LONG TERM GOALS: Target date: 02/29/2024  Pt will improve Headache Disability index score by at least 29 points to demonstrate improved QOL.  Baseline: 76 Goal status: INITIAL  2.  Pt will improve B hip strength to 4/5 to improve BLE functions and balance Baseline: 3/5 Goal status: INITIAL  3.  Pt will complete at least 15 STS in 30 secs to demonstrate improved LE strength and safety Baseline: 10reps in 30 secs Goal status: INITIAL  4.  Pt will improve ABC score by 500 points to demonstrate improved confidence with functions and decease fall risk.  Baseline: 650 Goal status: INITIAL  5.  Pt will improve Simple version of Activities Specific Balance Confidence Scale to demonstrate improved confidence with functions and decrease fall risk with mobility. Baseline: 26/45 Goal status: INITIAL  6.  Pt will complete ambulate  2 mins with distraction and Head movement without LOB.  Baseline: Multiple Loss of balance immediately with distraction  Goal status: INITIAL  ASSESSMENT:  CLINICAL IMPRESSION:    Patient arrives to treatment session motivated to participate. Session focused on standing BLE strengthening with increased resistance along with balance exercises. Demonstrates  difficulty with single leg activities on L LE. Patient will continue to benefit from skilled therapy to address remaining deficits in order to improve quality of life and return to PLOF.      OBJECTIVE IMPAIRMENTS: Abnormal gait, decreased balance, and difficulty walking.   ACTIVITY LIMITATIONS: standing, locomotion level, and walking and concentrating  PARTICIPATION LIMITATIONS: cleaning, driving, and community activity, using escalator, reaching for objects on top shelves, ladder climbing, walking in the parking log and getting out of car.   PERSONAL FACTORS: Time since onset of injury/illness/exacerbation are also affecting patient's functional outcome.   REHAB POTENTIAL: Fair Chronicity and medication dependent headache mangement  CLINICAL DECISION MAKING: Stable/uncomplicated  EVALUATION COMPLEXITY: Moderate  PLAN:  PT FREQUENCY: 1-2x/week  PT DURATION: 8 weeks  PLANNED INTERVENTIONS: 97110-Therapeutic exercises, 97530- Therapeutic activity, 97112- Neuromuscular re-education, 97535- Self Care, 16109- Manual therapy, and 97116- Gait training  PLAN FOR NEXT SESSION: B hip strengthening, Balance activities,     Maylon Peppers, PT, DPT Physical Therapist - [place]  [facility]  9:40 AM,02/05/24

## 2024-02-06 NOTE — Therapy (Signed)
 OUTPATIENT PHYSICAL THERAPY NEURO TREATMENT   Patient Name: Tammie Rocha MRN: [id number] DOB:10/14/1956, 68 y.o., female Today's Date: 02/07/2024   PCP: Joen Laura MD REFERRING PROVIDER: Alexander Mt FNP  END OF SESSION:  PT End of Session - 02/07/24 0944     Visit Number 6    Number of Visits 16    Date for PT Re-Evaluation 02/29/24    Progress Note Due on Visit 10    PT Start Time 0945    PT Stop Time 1026    PT Time Calculation (min) 41 min    Activity Tolerance Patient tolerated treatment well    Behavior During Therapy WFL for tasks assessed/performed                  Past Medical History:  Diagnosis Date   Cancer (HCC)    skin ca   Interstitial cystitis    Migraines    Past Surgical History:  Procedure Laterality Date   ABDOMINAL HYSTERECTOMY     There are no active problems to display for this patient.   ONSET DATE: Chronic balance problems but worsened since last one year. REFERRING DIAG:    THERAPY DIAG:  Balance Problems and Muscle weakness  Rationale for Evaluation and Treatment: Rehabilitation  SUBJECTIVE:                                                                                                                                                                                             SUBJECTIVE STATEMENT: Pt reported: " My Dr said I can get better with therapy. I can't drive by myself. I feel wobbly and bouncy when I walk. My body bounces on either side. 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 Its not my doctor's diagnosis.But that's how I feel.  Not every times but sometimes when my headache is on my R and my left feels weak and numb. Sometimes my headache starts from the back of my neck which makes my trigeminal nerve becomes painful. New medication is helping me. I use to drift more to my Right and now it goes either side. I feel weak and fatigued. I don't go out anywhere like before because I am fearful and unable to drive."    Pt accompanied by: self  PERTINENT HISTORY:  Pt has been under neurologist care for many years due to Migraine headaches and worsening balance problems. 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 Recent MRI from  10/2022 shows Mild aging of the brain since 2020. No recent insult or specific cause for symptoms.  PAIN:  Are you having pain? No  PRECAUTIONS: Fall  RED FLAGS: None   WEIGHT BEARING RESTRICTIONS: No  FALLS: Has patient fallen in last 6 months? No  LIVING ENVIRONMENT: Lives with: lives with their family Lives in: House/apartment Stairs: Yes: External: 3 steps; none Has following equipment at home: None  PLOF: Independent  PATIENT GOALS: " I want improve my walking without wobbling."  OBJECTIVE:  Note: Objective measures were completed at Evaluation unless otherwise noted.  DIAGNOSTIC FINDINGS:   COGNITION: Overall cognitive status: Within functional limits for tasks assessed   SENSATION: WFL  COORDINATION: L side Nose to Finger coordination mild impairment noted.   MUSCLE TONE: LLE: Within functional limits RLE WFL.   MUSCLE LENGTH: Hamstrings: WFL Thomas test: Single joint muscle tightness noted.  DTRs:  Patella: NT to be completed next session.   POSTURE: rounded shoulders, forward head, and decreased lumbar lordosis  LOWER EXTREMITY ROM:    WFL    LOWER EXTREMITY MMT:  B hip 3/5, B knees 4/5 and B ankles 4/5  GAIT: Gait pattern: step through pattern, uneven step length, mild  sways to Both sides which increases with distraction and head movement causing LOB corrected by Walls or self.  Distance walked: 131ft in gym and hallway Assistive device utilized: None Level of assistance: SBA Comments: Pt is at risk off falling due to balance issues an  could benefit from cane until improvement in Neur motor observed.   FUNCTIONAL TESTS:   PATIENT SURVEYS: 30 seconds chair stand test: 10 reps completed 10 meter walk test: 10 secs with multiple sways on R>L Berg Balance Scale: 45/56 ABC:650/1600= 40% self confident FOTO: 46/53  Simple version of Activities Specific Balance Confidence Scale: 26/45 Headache Disability Index: 76 severe disability                                                                                                                              TREATMENT DATE: 02/07/24    Subjective: Patient reports having a cold and makes it harder for her to breathe. Has MRI scheduled for 02/13/24.  Gait belt donned during all balance activities, CGA provided at all times throughout treatment session.    Therapeutic Activity - improved strength as needed to improve performance of CKC activities/functional movements and as needed for power production to prevent fall during episode of large postural perturbation  Nustep level 3 x 8 minutes to cardiorespiratory endurance and muscle strength  Sit to stand 2 x 10 with feet on airex  - spO2 drop to 90-92% after each set    Neuromuscular Re-education - for improved sensory integration, static and dynamic postural control, equilibrium and non-equilibrium coordination as needed for negotiating home and community environment and stepping over obstacles  Dynamic high marching in // bars x 3 laps D/B with 2# AW  Lateral stepping in //  bars x 3 laps with 2# AW   -spO2 88% after above 2 exercises 6" hurdle step overs with 2# AW x 3 laps (spO2 87%)  Standing on airex with feet together with vertical and horizontal head turns 2 x 30 seconds each direction  Tandem walking on blue line in // bars x 3 laps - LOB x 3   PATIENT EDUCATION: Education details: Significance of compliance to HEP,  Person educated: Patient Education method: Medical illustrator Education comprehension:  verbalized understanding and needs further education  HOME EXERCISE PROGRAM: Access Code: [facility] URL: [email or web address] Date: 01/10/2024 Prepared by: Maylon Peppers  Exercises - Sit to Stand with Arms Crossed  - 2-3 x daily - 5-7 x weekly - 3 sets - 10 reps - Standing March with Counter Support  - 2-3 x daily - 5-7 x weekly - 3 sets - 10 reps - Standing Hip Abduction with Counter Support  - 2-3 x daily - 5-7 x weekly - 3 sets - 10 reps - Standing Hip Extension with Counter Support  - 2-3 x daily - 5-7 x weekly - 3 sets - 10 reps - Heel Raises with Counter Support  - 2-3 x daily - 5-7 x weekly - 3 sets - 10 reps   GOALS: Goals reviewed with patient? Yes  SHORT TERM GOALS: Target date: 01/23/2024  Pt will demosntrate 100% independence and compliance with HEP to improve rehab potential.  Baseline: Needs to establish HEP.  Goal status: INITIAL  LONG TERM GOALS: Target date: 02/29/2024  Pt will improve Headache Disability index score by at least 29 points to demonstrate improved QOL.  Baseline: 76 Goal status: INITIAL  2.  Pt will improve B hip strength to 4/5 to improve BLE functions and balance Baseline: 3/5 Goal status: INITIAL  3.  Pt will complete at least 15 STS in 30 secs to demonstrate improved LE strength and safety Baseline: 10reps in 30 secs Goal status: INITIAL  4.  Pt will improve ABC score by 500 points to demonstrate improved confidence with functions and decease fall risk.  Baseline: 650 Goal status: INITIAL  5.  Pt will improve Simple version of Activities Specific Balance Confidence Scale to demonstrate improved confidence with functions and decrease fall risk with mobility. Baseline: 26/45 Goal status: INITIAL  6.  Pt will complete ambulate  2 mins with distraction and Head movement without LOB.  Baseline: Multiple Loss of balance immediately with distraction  Goal status: INITIAL  ASSESSMENT:  CLINICAL IMPRESSION:     Patient  arrives to treatment session motivated to participate. Patient requiring increased time for rest breaks this session due to fighting a cold. Session focused on static and dynamic balance activities with good tolerance. SpO2 as low as 87% during session with activity. Patient will continue to benefit from skilled therapy to address remaining deficits in order to improve quality of life and return to PLOF.      OBJECTIVE IMPAIRMENTS: Abnormal gait, decreased balance, and difficulty walking.   ACTIVITY LIMITATIONS: standing, locomotion level, and walking and concentrating  PARTICIPATION LIMITATIONS: cleaning, driving, and community activity, using escalator, reaching for objects on top shelves, ladder climbing, walking in the parking log and getting out of car.   PERSONAL FACTORS: Time since onset of injury/illness/exacerbation are also affecting patient's functional outcome.   REHAB POTENTIAL: Fair Chronicity and medication dependent headache mangement  CLINICAL DECISION MAKING: Stable/uncomplicated  EVALUATION COMPLEXITY: Moderate  PLAN:  PT FREQUENCY: 1-2x/week  PT DURATION: 8 weeks  PLANNED INTERVENTIONS:  97110-Therapeutic exercises, 97530- Therapeutic activity, [id number]- Neuromuscular re-education, [phone]- Self Care, 40981- Manual therapy, and 97116- Gait training  PLAN FOR NEXT SESSION: B hip strengthening, Balance activities,     Maylon Peppers, PT, DPT Physical Therapist - [place]  [facility]  9:45 AM,02/07/24

## 2024-02-07 ENCOUNTER — Encounter: Payer: Self-pay | Admitting: Physical Therapy

## 2024-02-07 ENCOUNTER — Ambulatory Visit: Payer: Medicare HMO | Admitting: Physical Therapy

## 2024-02-07 DIAGNOSIS — M6281 Muscle weakness (generalized): Secondary | ICD-10-CM

## 2024-02-07 DIAGNOSIS — R2681 Unsteadiness on feet: Secondary | ICD-10-CM

## 2024-02-12 ENCOUNTER — Ambulatory Visit: Payer: Medicare HMO | Attending: Neurology | Admitting: Physical Therapy

## 2024-02-12 ENCOUNTER — Encounter: Payer: Self-pay | Admitting: Physical Therapy

## 2024-02-12 DIAGNOSIS — M6281 Muscle weakness (generalized): Secondary | ICD-10-CM | POA: Diagnosis present

## 2024-02-12 DIAGNOSIS — R2681 Unsteadiness on feet: Secondary | ICD-10-CM | POA: Insufficient documentation

## 2024-02-12 NOTE — Therapy (Signed)
 OUTPATIENT PHYSICAL THERAPY NEURO TREATMENT   Patient Name: Tammie Rocha MRN: [id number] DOB:[birth year], 68 y.o., female Today's Date: 02/12/2024   PCP: Joen Laura MD REFERRING PROVIDER: Alexander Mt FNP  END OF SESSION:  PT End of Session - 02/12/24 1348     Visit Number 7    Number of Visits 16    Date for PT Re-Evaluation 02/29/24    Progress Note Due on Visit 10    PT Start Time 1348    PT Stop Time 1430    PT Time Calculation (min) 42 min    Activity Tolerance Patient tolerated treatment well    Behavior During Therapy WFL for tasks assessed/performed                   Past Medical History:  Diagnosis Date   Cancer (HCC)    skin ca   Interstitial cystitis    Migraines    Past Surgical History:  Procedure Laterality Date   ABDOMINAL HYSTERECTOMY     There are no active problems to display for this patient.   ONSET DATE: Chronic balance problems but worsened since last one year. REFERRING DIAG:    THERAPY DIAG:  Balance Problems and Muscle weakness  Rationale for Evaluation and Treatment: Rehabilitation  SUBJECTIVE:                                                                                                                                                                                             SUBJECTIVE STATEMENT: Pt reported: " My Dr said I can get better with therapy. I can't drive by myself. I feel wobbly and bouncy when I walk. My body bounces on either side. 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 Its not my doctor's diagnosis.But that's how I feel.  Not every times but sometimes when my headache is on my R and my left feels weak and numb. Sometimes my headache starts from the back of my neck which makes my trigeminal nerve becomes painful. New medication is helping me. I use to drift more to my Right and now it goes either side. I feel weak and fatigued. I don't go out anywhere like before because I am fearful and unable to drive."    Pt accompanied by: self  PERTINENT HISTORY:  Pt has been under neurologist care for many years due to Migraine headaches and worsening balance problems. 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 Recent MRI  from 10/2022 shows Mild aging of the brain since 2020. No recent insult or specific cause for symptoms.  PAIN:  Are you having pain? No  PRECAUTIONS: Fall  RED FLAGS: None   WEIGHT BEARING RESTRICTIONS: No  FALLS: Has patient fallen in last 6 months? No  LIVING ENVIRONMENT: Lives with: lives with their family Lives in: House/apartment Stairs: Yes: External: 3 steps; none Has following equipment at home: None  PLOF: Independent  PATIENT GOALS: " I want improve my walking without wobbling."  OBJECTIVE:  Note: Objective measures were completed at Evaluation unless otherwise noted.  DIAGNOSTIC FINDINGS:   COGNITION: Overall cognitive status: Within functional limits for tasks assessed   SENSATION: WFL  COORDINATION: L side Nose to Finger coordination mild impairment noted.   MUSCLE TONE: LLE: Within functional limits RLE WFL.   MUSCLE LENGTH: Hamstrings: WFL Thomas test: Single joint muscle tightness noted.   POSTURE: rounded shoulders, forward head, and decreased lumbar lordosis  LOWER EXTREMITY ROM:    WFL    LOWER EXTREMITY MMT:  B hip 3/5, B knees 4/5 and B ankles 4/5  GAIT: Gait pattern: step through pattern, uneven step length, mild  sways to Both sides which increases with distraction and head movement causing LOB corrected by Walls or self.  Distance walked: 130ft in gym and hallway Assistive device utilized: None Level of assistance: SBA Comments: Pt is at risk off falling due to balance issues an could benefit from cane until improvement in Neur  motor observed.   FUNCTIONAL TESTS:   PATIENT SURVEYS: 30 seconds chair stand test: 10 reps completed 10 meter walk test: 10 secs with multiple sways on R>L Berg Balance Scale: 45/56 ABC:650/1600= 40% self confident FOTO: 46/53  Simple version of Activities Specific Balance Confidence Scale: 26/45 Headache Disability Index: 76 severe disability                                                                                                                              TREATMENT DATE: 02/12/24    Subjective: Patient has MRI tomorrow for head/neck to investigate cause of frequent migraines and imbalance. Pt reports no recent falls; she reports intermittently stumbling into walls.   Gait belt donned during all balance activities, CGA provided at all times throughout treatment session.    SpO2: 96% at baseline  Therapeutic Activity - improved strength as needed to improve performance of CKC activities/functional movements and as needed for power production to prevent fall during episode of large postural perturbation  Nustep level 3 x 8 minutes to cardiorespiratory endurance and muscle strength  Sit to stand 2 x 10 with feet on airex  - spO2 drop to 90-95% after each set    Neuromuscular Re-education - for improved sensory integration, static and dynamic postural control, equilibrium and non-equilibrium coordination as needed for negotiating home and community environment and stepping over obstacles  Toe tapping 6-inch step, standing on Airex; 2x10 alternating Dynamic high marching in //  bars x 3 laps D/B with 4# AW  Lateral stepping in // bars x 3 laps with 2# AW   -spO2 88% after above 2 exercises 6" hurdle step overs, 3 consecutive hurdles, reciprocal pattern x 5 laps   -SpO2 to 92% Standing on airex with feet together with vertical and horizontal head turns 2 x 30 seconds each direction  Tandem walking on blue line in // bars x 5 laps  -SpO2 95%  PATIENT EDUCATION: Education  details: Significance of compliance to HEP,  Person educated: Patient Education method: Medical illustrator Education comprehension: verbalized understanding and needs further education  HOME EXERCISE PROGRAM: Access Code: [facility] URL: [email or web address] Date: 01/10/2024 Prepared by: Maylon Peppers  Exercises - Sit to Stand with Arms Crossed  - 2-3 x daily - 5-7 x weekly - 3 sets - 10 reps - Standing March with Counter Support  - 2-3 x daily - 5-7 x weekly - 3 sets - 10 reps - Standing Hip Abduction with Counter Support  - 2-3 x daily - 5-7 x weekly - 3 sets - 10 reps - Standing Hip Extension with Counter Support  - 2-3 x daily - 5-7 x weekly - 3 sets - 10 reps - Heel Raises with Counter Support  - 2-3 x daily - 5-7 x weekly - 3 sets - 10 reps   GOALS: Goals reviewed with patient? Yes  SHORT TERM GOALS: Target date: 01/23/2024  Pt will demosntrate 100% independence and compliance with HEP to improve rehab potential.  Baseline: Needs to establish HEP.  Goal status: INITIAL  LONG TERM GOALS: Target date: 02/29/2024  Pt will improve Headache Disability index score by at least 29 points to demonstrate improved QOL.  Baseline: 76 Goal status: INITIAL  2.  Pt will improve B hip strength to 4/5 to improve BLE functions and balance Baseline: 3/5 Goal status: INITIAL  3.  Pt will complete at least 15 STS in 30 secs to demonstrate improved LE strength and safety Baseline: 10reps in 30 secs Goal status: INITIAL  4.  Pt will improve ABC score by 500 points to demonstrate improved confidence with functions and decease fall risk.  Baseline: 650 Goal status: INITIAL  5.  Pt will improve Simple version of Activities Specific Balance Confidence Scale to demonstrate improved confidence with functions and decrease fall risk with mobility. Baseline: 26/45 Goal status: INITIAL  6.  Pt will complete ambulate  2 mins with distraction and Head movement without LOB.   Baseline: Multiple Loss of balance immediately with distraction  Goal status: INITIAL  ASSESSMENT:  CLINICAL IMPRESSION:     Patient arrives to treatment session motivated to participate. Patient does demonstrate ongoing episodes of SOB and O2 saturation dropping to 90-92% at lowest. Patient is continuing diagnostic workup for migraines and imbalance - head/neck MRI tomorrow. Pt has notable difficulty with postural control on compliant surface and difficulty maintaining smooth forward path of gait (tends to deviate to L>R). Patient will continue to benefit from skilled therapy to address remaining deficits in order to improve quality of life and return to PLOF.      OBJECTIVE IMPAIRMENTS: Abnormal gait, decreased balance, and difficulty walking.   ACTIVITY LIMITATIONS: standing, locomotion level, and walking and concentrating  PARTICIPATION LIMITATIONS: cleaning, driving, and community activity, using escalator, reaching for objects on top shelves, ladder climbing, walking in the parking log and getting out of car.   PERSONAL FACTORS: Time since onset of injury/illness/exacerbation are also affecting patient's functional outcome.  REHAB POTENTIAL: Fair Chronicity and medication dependent headache mangement  CLINICAL DECISION MAKING: Stable/uncomplicated  EVALUATION COMPLEXITY: Moderate  PLAN:  PT FREQUENCY: 1-2x/week  PT DURATION: 8 weeks  PLANNED INTERVENTIONS: 97110-Therapeutic exercises, 97530- Therapeutic activity, [id number]- Neuromuscular re-education, 97535- Self Care, 28413- Manual therapy, and 97116- Gait training  PLAN FOR NEXT SESSION: B hip strengthening, Balance activities   Consuela Mimes, PT, DPT [phone] Physical Therapist - [place]  [facility]  2:52 PM,02/12/24

## 2024-02-13 ENCOUNTER — Ambulatory Visit
Admission: RE | Admit: 2024-02-13 | Discharge: 2024-02-13 | Disposition: A | Discharge status: Home / Self Care | Payer: Medicare HMO | Source: Ambulatory Visit | Attending: Neurology | Admitting: Neurology

## 2024-02-13 DIAGNOSIS — R2689 Other abnormalities of gait and mobility: Secondary | ICD-10-CM

## 2024-02-13 DIAGNOSIS — G43109 Migraine with aura, not intractable, without status migrainosus: Secondary | ICD-10-CM

## 2024-02-13 DIAGNOSIS — M542 Cervicalgia: Secondary | ICD-10-CM

## 2024-02-13 DIAGNOSIS — R531 Weakness: Secondary | ICD-10-CM

## 2024-02-14 ENCOUNTER — Ambulatory Visit: Payer: Medicare HMO | Admitting: Physical Therapy

## 2024-02-18 ENCOUNTER — Telehealth: Payer: Self-pay | Admitting: Acute Care

## 2024-02-18 ENCOUNTER — Inpatient Hospital Stay: Admission: RE | Admit: 2024-02-18 | Payer: Medicare HMO | Source: Ambulatory Visit

## 2024-02-18 NOTE — Telephone Encounter (Signed)
 [facility] called and left a message on my phone on 02/15/24 stating they needed to clarify what type of CT the patient was to have done. LCS CT done 02/15/23 and CT high Res done  04/11/23. They are always 2nd guessing what CT needs to be done. Can either of you verify which CT should be done?

## 2024-02-19 ENCOUNTER — Ambulatory Visit: Payer: Medicare HMO | Admitting: Physical Therapy

## 2024-02-19 VITALS — BP 138/91 | HR 110

## 2024-02-19 DIAGNOSIS — R2681 Unsteadiness on feet: Secondary | ICD-10-CM

## 2024-02-19 DIAGNOSIS — M6281 Muscle weakness (generalized): Secondary | ICD-10-CM

## 2024-02-19 NOTE — Therapy (Signed)
 OUTPATIENT PHYSICAL THERAPY NEURO TREATMENT   Patient Name: Tammie Rocha MRN: [id number] DOB:[birth year], 68 y.o., female Today's Date: 02/19/2024  PCP: Joen Laura MD REFERRING PROVIDER: Alexander Mt FNP  END OF SESSION:  PT End of Session - 02/19/24 0917     Visit Number 8    Number of Visits 16    Date for PT Re-Evaluation 02/29/24    Progress Note Due on Visit 10    PT Start Time 0909    PT Stop Time 0949    PT Time Calculation (min) 40 min    Activity Tolerance Patient tolerated treatment well    Behavior During Therapy [facility] for tasks assessed/performed             Past Medical History:  Diagnosis Date   Cancer (HCC)    skin ca   Interstitial cystitis    Migraines    Past Surgical History:  Procedure Laterality Date   ABDOMINAL HYSTERECTOMY     There are no active problems to display for this patient.   ONSET DATE: Chronic balance problems but worsened since last one year. REFERRING DIAG:    THERAPY DIAG:  Balance Problems and Muscle weakness  Rationale for Evaluation and Treatment: Rehabilitation  SUBJECTIVE:                                                                                                                                                                                             SUBJECTIVE STATEMENT: Pt reported: " My Dr said I can get better with therapy. I can't drive by myself. I feel wobbly and bouncy when I walk. My body bounces on either side. 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 Its not my doctor's diagnosis.But that's how I feel.  Not every times but sometimes when my headache is on my R and my left feels weak and numb. Sometimes my headache starts from the back of my neck which makes my trigeminal nerve becomes painful. New medication is helping me. I use to drift more to my Right and now it goes either side. I feel weak and fatigued. I don't go out anywhere like before because I am fearful and unable to drive."   Pt  accompanied by: self  PERTINENT HISTORY:  Pt has been under neurologist care for many years due to Migraine headaches and worsening balance problems. 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 Recent MRI from 10/2022 shows Mild aging of the  brain since 2020. No recent insult or specific cause for symptoms.  PAIN:  Are you having pain? No  PRECAUTIONS: Fall  RED FLAGS: None   WEIGHT BEARING RESTRICTIONS: No  FALLS: Has patient fallen in last 6 months? No  LIVING ENVIRONMENT: Lives with: lives with their family Lives in: House/apartment Stairs: Yes: External: 3 steps; none Has following equipment at home: None  PLOF: Independent  PATIENT GOALS: " I want improve my walking without wobbling."  OBJECTIVE:  Note: Objective measures were completed at Evaluation unless otherwise noted.  DIAGNOSTIC FINDINGS:   COGNITION: Overall cognitive status: Within functional limits for tasks assessed   SENSATION: WFL  COORDINATION: L side Nose to Finger coordination mild impairment noted.   MUSCLE TONE: LLE: Within functional limits RLE WFL.   MUSCLE LENGTH: Hamstrings: WFL Thomas test: Single joint muscle tightness noted.   POSTURE: rounded shoulders, forward head, and decreased lumbar lordosis  LOWER EXTREMITY ROM:    WFL    LOWER EXTREMITY MMT:  B hip 3/5, B knees 4/5 and B ankles 4/5  GAIT: Gait pattern: step through pattern, uneven step length, mild  sways to Both sides which increases with distraction and head movement causing LOB corrected by Walls or self.  Distance walked: 118ft in gym and hallway Assistive device utilized: None Level of assistance: SBA Comments: Pt is at risk off falling due to balance issues an could benefit from cane until improvement in Neur motor  observed.   FUNCTIONAL TESTS:   PATIENT SURVEYS: 30 seconds chair stand test: 10 reps completed 10 meter walk test: 10 secs with multiple sways on R>L Berg Balance Scale: 45/56 ABC:650/1600= 40% self confident FOTO: 46/53  Simple version of Activities Specific Balance Confidence Scale: 26/45 Headache Disability Index: 76 severe disability                                                                                                                              TREATMENT DATE: 02/19/24     Today's Vitals   02/19/24 0912  BP: (!) 138/91  Pulse: (!) 110  SpO2: 94%   There is no height or weight on file to calculate BMI.    Subjective: Patient had MRI and is awaiting radiologist report. Patient reports having diaphoresis the previous evening, but no migraine episode. No chest pain or other pain reported. Patient reports completing her HEP on most days.   Gait belt donned during all balance activities, CGA provided at all times throughout treatment session.    SpO2: 96% at baseline  Therapeutic Activity - improved strength as needed to improve performance of CKC activities/functional movements and as needed for power production to prevent fall during episode of large postural perturbation  Nustep level 4 x 7 minutes to cardiorespiratory endurance and muscle strength   -O2 95% Sit to stand 2 x 10 with feet on airex  - spO2 drop to 93% after first set, 94% after second set Forward step  up to Airex pad; 1x15 with each LE   Neuromuscular Re-education - for improved sensory integration, static and dynamic postural control, equilibrium and non-equilibrium coordination as needed for negotiating home and community environment and stepping over obstacles  Toe tapping 6-inch step, standing on Airex; 2x10 alternating Lateral stepping in // bars, Blue Tband anchored on distal legs; 4x D/B 6" hurdle step overs, 3 consecutive hurdles, reciprocal pattern 2 x 4 laps D/B  -SpO2 to  92% Standing on airex with feet together with vertical and horizontal head turns 2 x 30 seconds each direction     *not today* Tandem walking on blue line in // bars x 5 laps  -SpO2 95% Dynamic high marching in // bars x 3 laps D/B with 4# AW    PATIENT EDUCATION: Education details: Significance of compliance to HEP,  Person educated: Patient Education method: Medical illustrator Education comprehension: verbalized understanding and needs further education  HOME EXERCISE PROGRAM: Access Code: [facility] URL: [email or web address] Date: 01/10/2024 Prepared by: Maylon Peppers  Exercises - Sit to Stand with Arms Crossed  - 2-3 x daily - 5-7 x weekly - 3 sets - 10 reps - Standing March with Counter Support  - 2-3 x daily - 5-7 x weekly - 3 sets - 10 reps - Standing Hip Abduction with Counter Support  - 2-3 x daily - 5-7 x weekly - 3 sets - 10 reps - Standing Hip Extension with Counter Support  - 2-3 x daily - 5-7 x weekly - 3 sets - 10 reps - Heel Raises with Counter Support  - 2-3 x daily - 5-7 x weekly - 3 sets - 10 reps   GOALS: Goals reviewed with patient? Yes  SHORT TERM GOALS: Target date: 01/23/2024  Pt will demosntrate 100% independence and compliance with HEP to improve rehab potential.  Baseline: Needs to establish HEP.  Goal status: INITIAL  LONG TERM GOALS: Target date: 02/29/2024  Pt will improve Headache Disability index score by at least 29 points to demonstrate improved QOL.  Baseline: 76 Goal status: INITIAL  2.  Pt will improve B hip strength to 4/5 to improve BLE functions and balance Baseline: 3/5 Goal status: INITIAL  3.  Pt will complete at least 15 STS in 30 secs to demonstrate improved LE strength and safety Baseline: 10reps in 30 secs Goal status: INITIAL  4.  Pt will improve ABC score by 500 points to demonstrate improved confidence with functions and decease fall risk.  Baseline: 650 Goal status: INITIAL  5.  Pt will  improve Simple version of Activities Specific Balance Confidence Scale to demonstrate improved confidence with functions and decrease fall risk with mobility. Baseline: 26/45 Goal status: INITIAL  6.  Pt will complete ambulate  2 mins with distraction and Head movement without LOB.  Baseline: Multiple Loss of balance immediately with distraction  Goal status: INITIAL  ASSESSMENT:  CLINICAL IMPRESSION:     Patient arrives to treatment session motivated to participate. SpO2 does not drop below low 90s and patient is able to progress dynamic balance work on compliant surface. Patient is unsure about whether or not she is progressing with PT; she is awaiting further diagnostic workup following her most recent MRI (awaiting radiologist's report). Patient will continue to benefit from skilled therapy to address remaining deficits in order to improve quality of life and return to PLOF.      OBJECTIVE IMPAIRMENTS: Abnormal gait, decreased balance, and difficulty walking.   ACTIVITY LIMITATIONS: standing, locomotion level, and  walking and concentrating  PARTICIPATION LIMITATIONS: cleaning, driving, and community activity, using escalator, reaching for objects on top shelves, ladder climbing, walking in the parking log and getting out of car.   PERSONAL FACTORS: Time since onset of injury/illness/exacerbation are also affecting patient's functional outcome.   REHAB POTENTIAL: Fair Chronicity and medication dependent headache mangement  CLINICAL DECISION MAKING: Stable/uncomplicated  EVALUATION COMPLEXITY: Moderate  PLAN:  PT FREQUENCY: 1-2x/week  PT DURATION: 8 weeks  PLANNED INTERVENTIONS: 97110-Therapeutic exercises, 97530- Therapeutic activity, [id number]- Neuromuscular re-education, 97535- Self Care, [id number]- Manual therapy, and [phone]- Gait training  PLAN FOR NEXT SESSION: B hip strengthening, Balance activities   Consuela Mimes, PT, DPT #[id number] Physical Therapist - [place]  [facility]  9:17 AM,02/19/24

## 2024-02-19 NOTE — Telephone Encounter (Signed)
 Per Care everywhere, pt has been seeing pulmonology at [facility] and had another HRCT 10/2023.

## 2024-02-21 ENCOUNTER — Other Ambulatory Visit: Payer: Self-pay

## 2024-02-21 DIAGNOSIS — Z122 Encounter for screening for malignant neoplasm of respiratory organs: Secondary | ICD-10-CM

## 2024-02-21 DIAGNOSIS — Z87891 Personal history of nicotine dependence: Secondary | ICD-10-CM

## 2024-02-21 NOTE — Telephone Encounter (Signed)
 Pt has been scheduled for a LCS CT on 03/03/2024 at 11:00 am at [facility].

## 2024-02-25 ENCOUNTER — Encounter: Payer: Self-pay | Admitting: Physical Therapy

## 2024-02-25 ENCOUNTER — Ambulatory Visit: Payer: Medicare HMO | Admitting: Physical Therapy

## 2024-02-25 DIAGNOSIS — R2681 Unsteadiness on feet: Secondary | ICD-10-CM | POA: Diagnosis not present

## 2024-02-25 DIAGNOSIS — M6281 Muscle weakness (generalized): Secondary | ICD-10-CM

## 2024-02-25 NOTE — Therapy (Signed)
 OUTPATIENT PHYSICAL THERAPY NEURO TREATMENT   Patient Name: Tammie Rocha MRN: [id number] DOB:1955/12/21, 68 y.o., female Today's Date: 02/25/2024  PCP: Joen Laura MD REFERRING PROVIDER: Alexander Mt FNP  END OF SESSION:  PT End of Session - 02/25/24 0937     Visit Number 9    Number of Visits 16    Date for PT Re-Evaluation 02/29/24    Progress Note Due on Visit 10    PT Start Time 0945    PT Stop Time 1027    PT Time Calculation (min) 42 min    Activity Tolerance Patient tolerated treatment well    Behavior During Therapy WFL for tasks assessed/performed             Past Medical History:  Diagnosis Date   Cancer (HCC)    skin ca   Interstitial cystitis    Migraines    Past Surgical History:  Procedure Laterality Date   ABDOMINAL HYSTERECTOMY     There are no active problems to display for this patient.   ONSET DATE: Chronic balance problems but worsened since last one year. REFERRING DIAG:    THERAPY DIAG:  Balance Problems and Muscle weakness  Rationale for Evaluation and Treatment: Rehabilitation  SUBJECTIVE:                                                                                                                                                                                             SUBJECTIVE STATEMENT: Pt reported: " My Dr said I can get better with therapy. I can't drive by myself. I feel wobbly and bouncy when I walk. My body bounces on either side. 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 Its not my doctor's diagnosis.But that's how I feel.  Not every times but sometimes when my headache is on my R and my left feels weak and numb. Sometimes my headache starts from the back of my neck which makes my trigeminal nerve becomes painful. New medication is helping me. I use to drift more to my Right and now it goes either side. I feel weak and fatigued. I don't go out anywhere like before because I am fearful and unable to drive."   Pt  accompanied by: self  PERTINENT HISTORY:  Pt has been under neurologist care for many years due to Migraine headaches and worsening balance problems. 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 Recent MRI from 10/2022 shows Mild aging of the  brain since 2020. No recent insult or specific cause for symptoms.  PAIN:  Are you having pain? No  PRECAUTIONS: Fall  RED FLAGS: None   WEIGHT BEARING RESTRICTIONS: No  FALLS: Has patient fallen in last 6 months? No  LIVING ENVIRONMENT: Lives with: lives with their family Lives in: House/apartment Stairs: Yes: External: 3 steps; none Has following equipment at home: None  PLOF: Independent  PATIENT GOALS: " I want improve my walking without wobbling."  OBJECTIVE:  Note: Objective measures were completed at Evaluation unless otherwise noted.  DIAGNOSTIC FINDINGS:   COGNITION: Overall cognitive status: Within functional limits for tasks assessed   SENSATION: WFL  COORDINATION: L side Nose to Finger coordination mild impairment noted.   MUSCLE TONE: LLE: Within functional limits RLE WFL.   MUSCLE LENGTH: Hamstrings: WFL Thomas test: Single joint muscle tightness noted.   POSTURE: rounded shoulders, forward head, and decreased lumbar lordosis  LOWER EXTREMITY ROM:    WFL    LOWER EXTREMITY MMT:  B hip 3/5, B knees 4/5 and B ankles 4/5  GAIT: Gait pattern: step through pattern, uneven step length, mild  sways to Both sides which increases with distraction and head movement causing LOB corrected by Walls or self.  Distance walked: 119ft in gym and hallway Assistive device utilized: None Level of assistance: SBA Comments: Pt is at risk off falling due to balance issues an could benefit from cane until improvement in Neur motor  observed.   FUNCTIONAL TESTS:   PATIENT SURVEYS: 30 seconds chair stand test: 10 reps completed 10 meter walk test: 10 secs with multiple sways on R>L Berg Balance Scale: 45/56 ABC:650/1600= 40% self confident FOTO: 46/53  Simple version of Activities Specific Balance Confidence Scale: 26/45 Headache Disability Index: 76 severe disability                                                                                                                              TREATMENT DATE: 02/25/24      Subjective: Patient reports some respiratory complaints. No new c/o this AM. Patient reports no recent migraines. Pt reports difficulty getting from car to office due to "feet feeling tangled up." No SOB.   Gait belt donned during all balance activities, CGA provided at all times throughout treatment session.    SpO2: 96% at baseline   Therapeutic Activity - improved strength as needed to improve performance of CKC activities/functional movements and as needed for power production to prevent fall during episode of large postural perturbation  Nustep level 4 x 7 minutes to cardiorespiratory endurance and muscle strength   -O2 95% Sit to stand 2 x 10 with feet on airex  - SpO2 drop to 93% after first set, 94% after second set  *not today* Forward step up to Airex pad; 1x15 with each LE   Neuromuscular Re-education - for improved sensory integration, static and dynamic postural control, equilibrium and non-equilibrium coordination as needed for negotiating home  and community environment and stepping over obstacles  Dynamic high marching in // bars x 4 laps D/B with 5# AW   -91% SpO2, 96% after rest period Toe tapping 6-inch step, standing on Airex; 1x15, 1x10 alternating  -92% SpO2 6" hurdle step overs, 3 consecutive hurdles, reciprocal pattern 2 x 4 laps D/B  -SpO2 to 92% Alternating lateral cone tap; x5 D/B length of bars; 4 cones on R and L side   Forward gait with head turns in  hallway; horizontal and vertical; 2x D/B length of hall    *not today* Standing on airex with feet together with vertical and horizontal head turns 2 x 30 seconds each direction  Lateral stepping in // bars, Blue Tband anchored on distal legs; 4x D/B Tandem walking on blue line in // bars x 5 laps  -SpO2 95%   PATIENT EDUCATION: Education details: Significance of compliance to HEP,  Person educated: Patient Education method: Medical illustrator Education comprehension: verbalized understanding and needs further education  HOME EXERCISE PROGRAM: Access Code: [facility] URL: [email or web address] Date: 01/10/2024 Prepared by: Maylon Peppers  Exercises - Sit to Stand with Arms Crossed  - 2-3 x daily - 5-7 x weekly - 3 sets - 10 reps - Standing March with Counter Support  - 2-3 x daily - 5-7 x weekly - 3 sets - 10 reps - Standing Hip Abduction with Counter Support  - 2-3 x daily - 5-7 x weekly - 3 sets - 10 reps - Standing Hip Extension with Counter Support  - 2-3 x daily - 5-7 x weekly - 3 sets - 10 reps - Heel Raises with Counter Support  - 2-3 x daily - 5-7 x weekly - 3 sets - 10 reps   GOALS: Goals reviewed with patient? Yes  SHORT TERM GOALS: Target date: 01/23/2024  Pt will demosntrate 100% independence and compliance with HEP to improve rehab potential.  Baseline: Needs to establish HEP.  Goal status: INITIAL  LONG TERM GOALS: Target date: 02/29/2024  Pt will improve Headache Disability index score by at least 29 points to demonstrate improved QOL.  Baseline: 76 Goal status: INITIAL  2.  Pt will improve B hip strength to 4/5 to improve BLE functions and balance Baseline: 3/5 Goal status: INITIAL  3.  Pt will complete at least 15 STS in 30 secs to demonstrate improved LE strength and safety Baseline: 10reps in 30 secs Goal status: INITIAL  4.  Pt will improve ABC score by 500 points to demonstrate improved confidence with functions and decease  fall risk.  Baseline: 650 Goal status: INITIAL  5.  Pt will improve Simple version of Activities Specific Balance Confidence Scale to demonstrate improved confidence with functions and decrease fall risk with mobility. Baseline: 26/45 Goal status: INITIAL  6.  Pt will complete ambulate  2 mins with distraction and Head movement without LOB.  Baseline: Multiple Loss of balance immediately with distraction  Goal status: INITIAL  ASSESSMENT:  CLINICAL IMPRESSION:     Patient has intermittent loss of smooth forward path with horizontal head turns more so than vertical head turns during gait trials with head turns in hallway. Patient has not yet received radiologist's report for her recent head and C-spine MRI. She exhibits improving ability to target lower extremities and clear obstacles. Pt will be due to re-assessment next visit. Patient will continue to benefit from skilled therapy to address remaining deficits in order to improve quality of life and return to PLOF.  OBJECTIVE IMPAIRMENTS: Abnormal gait, decreased balance, and difficulty walking.   ACTIVITY LIMITATIONS: standing, locomotion level, and walking and concentrating  PARTICIPATION LIMITATIONS: cleaning, driving, and community activity, using escalator, reaching for objects on top shelves, ladder climbing, walking in the parking log and getting out of car.   PERSONAL FACTORS: Time since onset of injury/illness/exacerbation are also affecting patient's functional outcome.   REHAB POTENTIAL: Fair Chronicity and medication dependent headache mangement  CLINICAL DECISION MAKING: Stable/uncomplicated  EVALUATION COMPLEXITY: Moderate  PLAN:  PT FREQUENCY: 1-2x/week  PT DURATION: 8 weeks  PLANNED INTERVENTIONS: 97110-Therapeutic exercises, 97530- Therapeutic activity, [id number]- Neuromuscular re-education, 97535- Self Care, [id number]- Manual therapy, and [phone]- Gait training  PLAN FOR NEXT SESSION: B hip strengthening, Balance  activities   Consuela Mimes, PT, DPT [phone] Physical Therapist - [place]  [facility]  10:10 AM,02/25/24

## 2024-02-28 ENCOUNTER — Ambulatory Visit: Payer: Medicare HMO | Admitting: Physical Therapy

## 2024-02-28 DIAGNOSIS — R2681 Unsteadiness on feet: Secondary | ICD-10-CM | POA: Diagnosis not present

## 2024-02-28 DIAGNOSIS — M6281 Muscle weakness (generalized): Secondary | ICD-10-CM

## 2024-02-28 NOTE — Therapy (Signed)
 OUTPATIENT PHYSICAL THERAPY TREATMENT AND PROGRESS NOTE   Dates of reporting period  01/01/24   to   02/27/1990    Patient Name: Tammie Rocha MRN: [id number] DOB:1956-01-02, 68 y.o., female Today's Date: 02/28/2024  PCP: Joen Laura MD REFERRING PROVIDER: Alexander Mt FNP  END OF SESSION:  PT End of Session - 02/28/24 1419     Visit Number 10    Number of Visits 16    Date for PT Re-Evaluation 02/29/24    Progress Note Due on Visit 10    PT Start Time 1421    PT Stop Time 1504    PT Time Calculation (min) 43 min    Activity Tolerance Patient tolerated treatment well    Behavior During Therapy WFL for tasks assessed/performed             Past Medical History:  Diagnosis Date   Cancer (HCC)    skin ca   Interstitial cystitis    Migraines    Past Surgical History:  Procedure Laterality Date   ABDOMINAL HYSTERECTOMY     There are no active problems to display for this patient.   ONSET DATE: Chronic balance problems but worsened since last one year. REFERRING DIAG:    THERAPY DIAG:  Balance Problems and Muscle weakness  Rationale for Evaluation and Treatment: Rehabilitation  SUBJECTIVE:                                                                                                                                                                                             SUBJECTIVE STATEMENT: Pt reported: " My Dr said I can get better with therapy. I can't drive by myself. I feel wobbly and bouncy when I walk. My body bounces on either side. 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 Its not my doctor's diagnosis.But that's how I feel.  Not every times but sometimes when my headache is on my R and my left feels weak and numb. Sometimes my headache starts from the back of my neck which makes my trigeminal nerve becomes painful. New medication is helping me. I use to drift more to my Right and now it goes either side. I feel weak and fatigued. I don't go out anywhere  like before because I am fearful and unable to drive."   Pt accompanied by: self  PERTINENT HISTORY:  Pt has been under neurologist care for many years due to Migraine headaches and worsening balance problems. 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 Recent MRI from 10/2022 shows Mild aging of the brain since 2020. No recent insult or specific cause for symptoms.  PAIN:  Are you having pain? No  PRECAUTIONS: Fall  RED FLAGS: None   WEIGHT BEARING RESTRICTIONS: No  FALLS: Has patient fallen in last 6 months? No  LIVING ENVIRONMENT: Lives with: lives with their family Lives in: House/apartment Stairs: Yes: External: 3 steps; none Has following equipment at home: None  PLOF: Independent  PATIENT GOALS: " I want improve my walking without wobbling."  OBJECTIVE:  Note: Objective measures were completed at Evaluation unless otherwise noted.  DIAGNOSTIC FINDINGS:   COGNITION: Overall cognitive status: Within functional limits for tasks assessed   SENSATION: WFL  COORDINATION: L side Nose to Finger coordination mild impairment noted.   MUSCLE TONE: LLE: Within functional limits RLE WFL.   MUSCLE LENGTH: Hamstrings: WFL Thomas test: Single joint muscle tightness noted.   POSTURE: rounded shoulders, forward head, and decreased lumbar lordosis  LOWER EXTREMITY ROM:    WFL    LOWER EXTREMITY MMT:  B hip 3/5, B knees 4/5 and B ankles 4/5  GAIT: Gait pattern: step through pattern, uneven step length, mild  sways to Both sides which increases with distraction and head movement causing LOB corrected by Walls or self.  Distance walked: 128ft in gym and hallway Assistive device utilized: None Level of assistance: SBA Comments: Pt is at risk off falling due to balance issues  an could benefit from cane until improvement in Neur motor observed.   FUNCTIONAL TESTS:   PATIENT SURVEYS: 30 seconds chair stand test: 10 reps completed 10 meter walk test: 10 secs with multiple sways on R>L Berg Balance Scale: 45/56 ABC:650/1600= 40% self confident FOTO: 46/53  Simple version of Activities Specific Balance Confidence Scale: 26/45 Headache Disability Index: 76 severe disability                                                                                                                              TREATMENT DATE: 02/28/24      Subjective: Patient reports no notable change in PT to date. Patient reports one episode of near-fall yesterday when she stumbled laterally and went into wall. Patient reports feeling off balance first thing this AM. Patient does not use walking aid at this time for functional mobility. No significant fall to date. Pt reports concern for falling when completing errands/shopping in public. Pt now has handicap parking placard now. Patient reports wanting to walk down her driveway for cardiovascular exercise. Pt had migraine episode yesterday. 5-7 days/week pt is affected by migraine symptoms.   Gait belt donned during all balance activities, CGA provided at all times throughout treatment session.    SpO2: 93-96% at baseline   Therapeutic Activity - improved strength as needed to improve performance of CKC activities/functional movements and as needed for power production to prevent fall during episode of large postural  perturbation  Nustep level 4 x 7 minutes to cardiorespiratory endurance and muscle strength   -O2 92-93%   *GOAL UPDATE PERFORMED   *not today* Sit to stand 2 x 10 with feet on airex  - SpO2 drop to 93% after first set, 94% after second set Forward step up to Airex pad; 1x15 with each LE   Neuromuscular Re-education - for improved sensory integration, static and dynamic postural control, equilibrium and non-equilibrium  coordination as needed for negotiating home and community environment and stepping over obstacles   Forward gait with head turns in hallway, identifying letters on sticky notes; 2 min with pt walking D/B 70-ft hallway   *not today* Dynamic high marching in // bars x 4 laps D/B with 5# AW  Toe tapping 6-inch step, standing on Airex; 1x15, 1x10 alternating 6" hurdle step overs, 3 consecutive hurdles, reciprocal pattern 2 x 4 laps D/B Alternating lateral cone tap; x5 D/B length of bars; 4 cones on R and L side  Standing on airex with feet together with vertical and horizontal head turns 2 x 30 seconds each direction  Lateral stepping in // bars, Blue Tband anchored on distal legs; 4x D/B Tandem walking on blue line in // bars x 5 laps  -SpO2 95%   PATIENT EDUCATION: Education details: Significance of compliance to HEP,  Person educated: Patient Education method: Medical illustrator Education comprehension: verbalized understanding and needs further education  HOME EXERCISE PROGRAM: Access Code: [facility] URL: [email or web address] Date: 01/10/2024 Prepared by: Maylon Peppers  Exercises - Sit to Stand with Arms Crossed  - 2-3 x daily - 5-7 x weekly - 3 sets - 10 reps - Standing March with Counter Support  - 2-3 x daily - 5-7 x weekly - 3 sets - 10 reps - Standing Hip Abduction with Counter Support  - 2-3 x daily - 5-7 x weekly - 3 sets - 10 reps - Standing Hip Extension with Counter Support  - 2-3 x daily - 5-7 x weekly - 3 sets - 10 reps - Heel Raises with Counter Support  - 2-3 x daily - 5-7 x weekly - 3 sets - 10 reps   GOALS: Goals reviewed with patient? Yes  SHORT TERM GOALS: Target date: 01/23/2024  Pt will demosntrate 100% independence and compliance with HEP to improve rehab potential.  Baseline: Needs to establish HEP.  02/28/24: Pt keeping up HEP 5/7 days of week.  Goal status: ACHIEVED   LONG TERM GOALS: Target date: 02/29/2024  Pt will  improve Headache Disability index score by at least 29 points to demonstrate improved QOL.  Baseline: 76 02/28/24: 84 Goal status: NOT MET   2.  Pt will improve B hip strength to 4/5 to improve BLE functions and balance Baseline: 3/5 02/28/24: LE MMT R/L: Hip flexion 4+/4+, Hip abduction 5/5, Hip adduction 5/5, Hip ER 4+/4+, Hip IR 4+/4+ Goal status: ACHIEVED  3.  Pt will complete at least 15 STS in 30 secs to demonstrate improved LE strength and safety Baseline: 10reps in 30 secs 02/28/24: 12reps in 30 secs  Goal status: IN PROGRESS  4.  Pt will improve ABC score by 500 points to demonstrate improved confidence with functions and decease fall risk.  Baseline: 650 02/28/24: 890 Goal status: IN PROGRESS  5.  Pt will improve Simple version of Activities Specific Balance Confidence Scale to demonstrate improved confidence with functions and decrease fall risk with mobility. Baseline: 26/45 02/28/24: 24/45 Goal status: NOT MET   6.  Pt will complete ambulate  2 mins with distraction and Head movement without LOB.  Baseline: Multiple Loss of balance immediately with distraction  02/28/24: Loss of smooth forward path with forward gait multiple times, one LOB with staggering into wall.  Goal status: IN PROGRESS   ASSESSMENT:  CLINICAL IMPRESSION:     Patient has made fair progress with 30-sec sit to stand test, and she has met her hip strength goal. Pt has no improvement in Headache Disability Index. ABC score is modestly improved, though it is short of MCID. Pt has not improved simple ABC scale. Pt is able to ambulate 2 mins with distraction and multiple head turns with dual tasking (long-term goal #6) with loss of smooth forward path of gait with singular loss of balance versus immediate loss of gait stability noted at eval. Pt feels subjectively that her condition has not improved; she is awaiting further f/u with neurology after her MRIs are read by radiology. Pt would like to speak with  neurologist prior to continuing PT. Patient will continue to benefit from skilled therapy (pending f/u with neurologist) to address remaining deficits in order to improve quality of life and return to PLOF.      OBJECTIVE IMPAIRMENTS: Abnormal gait, decreased balance, and difficulty walking.   ACTIVITY LIMITATIONS: standing, locomotion level, and walking and concentrating  PARTICIPATION LIMITATIONS: cleaning, driving, and community activity, using escalator, reaching for objects on top shelves, ladder climbing, walking in the parking log and getting out of car.   PERSONAL FACTORS: Time since onset of injury/illness/exacerbation are also affecting patient's functional outcome.   REHAB POTENTIAL: Fair Chronicity and medication dependent headache mangement  CLINICAL DECISION MAKING: Stable/uncomplicated  EVALUATION COMPLEXITY: Moderate   PLAN:  PT FREQUENCY: 1-2x/week  PT DURATION: 4-6 weeks  PLANNED INTERVENTIONS: 97110-Therapeutic exercises, 97530- Therapeutic activity, [id number]- Neuromuscular re-education, 97535- Self Care, 86578- Manual therapy, and 97116- Gait training  PLAN FOR NEXT SESSION: Pt to f/u with neurology and obtain results from recent head and cervical spine MRI/ further diagnostic workup. Further PT pending these follow-ups.    Consuela Mimes, PT, DPT [phone] Physical Therapist - [place]  [facility]  2:19 PM,02/28/24

## 2024-03-03 ENCOUNTER — Ambulatory Visit
Admission: RE | Admit: 2024-03-03 | Discharge: 2024-03-03 | Disposition: A | Discharge status: Home / Self Care | Source: Ambulatory Visit | Attending: Family Medicine | Admitting: Family Medicine

## 2024-03-03 DIAGNOSIS — Z87891 Personal history of nicotine dependence: Secondary | ICD-10-CM

## 2024-03-03 DIAGNOSIS — Z122 Encounter for screening for malignant neoplasm of respiratory organs: Secondary | ICD-10-CM

## 2024-03-05 NOTE — Addendum Note (Signed)
 Addended by: Gertie Exon on: 03/05/2024 09:12 AM   Modules accepted: Orders

## 2024-04-07 ENCOUNTER — Other Ambulatory Visit: Payer: Self-pay

## 2024-04-07 DIAGNOSIS — Z87891 Personal history of nicotine dependence: Secondary | ICD-10-CM

## 2024-04-07 DIAGNOSIS — Z122 Encounter for screening for malignant neoplasm of respiratory organs: Secondary | ICD-10-CM

## 2024-05-19 ENCOUNTER — Ambulatory Visit (INDEPENDENT_AMBULATORY_CARE_PROVIDER_SITE_OTHER): Admitting: Urology

## 2024-05-19 VITALS — BP 116/82 | HR 109

## 2024-05-19 DIAGNOSIS — R3121 Asymptomatic microscopic hematuria: Secondary | ICD-10-CM

## 2024-05-19 DIAGNOSIS — N3010 Interstitial cystitis (chronic) without hematuria: Secondary | ICD-10-CM | POA: Diagnosis not present

## 2024-05-19 LAB — URINALYSIS, COMPLETE
Bilirubin, UA: NEGATIVE
Ketones, UA: NEGATIVE
Leukocytes,UA: NEGATIVE
Nitrite, UA: NEGATIVE
Specific Gravity, UA: 1.02 (ref 1.005–1.030)
Urobilinogen, Ur: 0.2 mg/dL (ref 0.2–1.0)
pH, UA: 6 (ref 5.0–7.5)

## 2024-05-19 LAB — MICROSCOPIC EXAMINATION: RBC, Urine: >30 /HPF — AB (ref 0–2)

## 2024-05-19 MED ORDER — NITROFURANTOIN MACROCRYSTAL 100 MG PO CAPS: 100.0000 mg | ORAL_CAPSULE | Freq: Two times a day (BID) | ORAL | 0 refills | Status: DC

## 2024-05-19 MED ORDER — DOXYCYCLINE HYCLATE 100 MG PO CAPS: 100.0000 mg | ORAL_CAPSULE | Freq: Two times a day (BID) | ORAL | 0 refills | Status: AC

## 2024-05-19 NOTE — Progress Notes (Addendum)
 05/19/2024 9:16 AM   Tammie Rocha 1956/08/03 [id number]  Referring provider: Claudine Cullens, MD [address] [place],  [place] [address]  Chief Complaint  Patient presents with   Establish Care    Interstitial cystitis (chronic) without hematuria R30.0ICD-10-CMDysuria    HPI: I was consulted to assess the patient who says she has had interstitial cystitis for 20 years.  She takes Elmiron once a day.  She gets it from her family doctor.  If she has a rare flareup she takes something called Uro-MP also prescribed by her primary care.  She meant today because she has not seen a urologist for many years.  It appears she was on amitriptyline and she takes pain medication  She normally voids every 2 hours.  No nocturia.  Wears 1 light pad a day for incontinence but many days she does not leak.  She has had a previous hydrodistention and a laser procedure  She has had a hysterectomy  No history of bladder infections or kidney stones.   PMH: Past Medical History:  Diagnosis Date   Cancer (HCC)    skin ca   Interstitial cystitis    Migraines     Surgical History: Past Surgical History:  Procedure Laterality Date   ABDOMINAL HYSTERECTOMY      Home Medications:  Allergies as of 05/19/2024       Reactions   Penicillins Itching        Medication List        Accurate as of May 19, 2024  9:16 AM. If you have any questions, ask your nurse or doctor.          Aimovig 140 MG/ML Soaj Generic drug: Erenumab-aooe SMARTSIG:140 Milligram(s) SUB-Q Once a Month   ALPRAZolam 0.5 MG tablet Commonly known as: XANAX Take 0.5 mg by mouth at bedtime as needed.   aspirin 81 MG tablet Take 81 mg by mouth daily.   Emgality 120 MG/ML Soaj Generic drug: Galcanezumab-gnlm Inject into the skin.   MAGNESIUM PO Take 1,000 mg by mouth daily.   metoprolol succinate 100 MG 24 hr tablet Commonly known as: TOPROL-XL Take 100 mg by mouth daily.   multivitamin tablet Take 1  tablet by mouth daily.   mupirocin  ointment 2 % Commonly known as: BACTROBAN  Apply 1 Application topically 2 (two) times daily.   nortriptyline 50 MG capsule Commonly known as: PAMELOR Take 100 mg by mouth daily.   Nurtec 75 MG Tbdp Generic drug: Rimegepant Sulfate Take 1 tablet by mouth as directed.   omeprazole 40 MG capsule Commonly known as: PRILOSEC Take by mouth.   oxyCODONE-acetaminophen 5-325 MG tablet Commonly known as: PERCOCET/ROXICET Take by mouth.   pentosan polysulfate 100 MG capsule Commonly known as: ELMIRON Take 100 mg by mouth 3 (three) times daily.   Ubrelvy 100 MG Tabs Generic drug: Ubrogepant Take by mouth.   Venlafaxine HCl 225 MG Tb24 Take 225 mg by mouth daily.   VITAMIN C PO Take 1 tablet by mouth daily.   VITAMIN D (CHOLECALCIFEROL) PO Take 1 tablet by mouth daily.        Allergies:  Allergies  Allergen Reactions   Penicillins Itching    Family History: Family History  Problem Relation Age of Onset   Alzheimer's disease Mother    Pulmonary fibrosis Father    Aneurysm Father    Breast cancer Neg Hx     Social History:  reports that she quit smoking about 18 months ago. Her smoking  use included cigarettes. She started smoking about 51 years ago. She has a 62.5 pack-year smoking history. She has quit using smokeless tobacco. She reports that she does not currently use alcohol. She reports that she does not use drugs.  ROS:                                        Physical Exam: There were no vitals taken for this visit.  Constitutional:  Alert and oriented, No acute distress. HEENT: [place] AT, moist mucus membranes.  Trachea midline, no masses.  Laboratory Data: No results found for: "WBC", "HGB", "HCT", "MCV", "PLT"  No results found for: "CREATININE"  No results found for: "PSA"  No results found for: "TESTOSTERONE"  No results found for: "HGBA1C"  Urinalysis No results found for: "COLORURINE",  "APPEARANCEUR", "LABSPEC", "PHURINE", "GLUCOSEU", "HGBUR", "BILIRUBINUR", "KETONESUR", "PROTEINUR", "UROBILINOGEN", "NITRITE", "LEUKOCYTESUR"  Pertinent Imaging: Urine reviewed.  Urine sent for culture.  Chart reviewed  Assessment & Plan: Patient has stable interstitial cystitis and gets her medications from her primary care.  I did not think she needed further investigation or treatment at this stage  Patient had blood in the urine but the urine looked infected today.  She said in the last day it has changed color.  She does get uncommon bladder infection sometimes being doxycycline.  She quit smoking 2 years ago.  Takes daily aspirin.  I was going to send in Macrodantin but see below.  See extender in 3 weeks.  If she still has blood in the urine order CT scan and have her come back and see me for cystoscopy.  She agreed with the plan  The patient came back and states she cannot take Macrodantin because she has some pulmonary fibrosis.  She asked again for doxycycline so I sent in doxycycline 100 mg twice a day for 7 days  1. Interstitial cystitis (Primary)  - Urinalysis, Complete   No follow-ups on file.  Devorah Fonder, MD  [facility] [address], [address] [place], [place] [address] [phone]

## 2024-05-19 NOTE — Addendum Note (Signed)
 Addended by: Natha Bair on: 05/19/2024 09:55 AM   Modules accepted: Orders

## 2024-05-22 LAB — CULTURE, URINE COMPREHENSIVE

## 2024-06-09 ENCOUNTER — Ambulatory Visit (INDEPENDENT_AMBULATORY_CARE_PROVIDER_SITE_OTHER): Admitting: Physician Assistant

## 2024-06-09 DIAGNOSIS — R3129 Other microscopic hematuria: Secondary | ICD-10-CM

## 2024-06-09 LAB — URINALYSIS, COMPLETE
Bilirubin, UA: NEGATIVE
Glucose, UA: NEGATIVE
Leukocytes,UA: NEGATIVE
Nitrite, UA: NEGATIVE
RBC, UA: NEGATIVE
Specific Gravity, UA: 1.025 (ref 1.005–1.030)
Urobilinogen, Ur: 0.2 mg/dL (ref 0.2–1.0)
pH, UA: 6 (ref 5.0–7.5)

## 2024-06-09 LAB — MICROSCOPIC EXAMINATION: Epithelial Cells (non renal): >10 /HPF — AB (ref 0–10)

## 2024-06-09 NOTE — Progress Notes (Signed)
 06/09/2024 3:05 PM   Tammie Rocha 1956-02-07 [id number]  CC: Chief Complaint  Patient presents with   Follow-up   HPI: Tammie Rocha is a 68 y.o. female with PMH interstitial cystitis on Elmiron once daily per her PCP with a recent history of microscopic hematuria in the setting of possible UTI who presents today for follow-up and repeat UA.   Today she reports she is asymptomatic of UTI and denies flank pain.  No personal or family history of nephrolithiasis.  In-office UA today positive for trace protein and trace ketones; urine microscopy with >10 epithelial cells/hpf, calcium oxalate crystals, and many bacteria.  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 If you have any questions, ask your nurse or doctor.          STOP taking these medications    Aimovig 140 MG/ML Soaj Generic drug: Erenumab-aooe   Nurtec 75 MG Tbdp Generic drug: Rimegepant Sulfate       TAKE these medications    ALPRAZolam 0.5 MG tablet Commonly known as: XANAX Take 0.5 mg by mouth at bedtime as needed.   aspirin 81 MG tablet Take 81 mg by mouth daily.   doxycycline  100 MG capsule Commonly known as: VIBRAMYCIN  Take 1 capsule (100 mg total) by mouth 2 (two) times daily.   Emgality 120 MG/ML Soaj Generic drug: Galcanezumab-gnlm Inject into the skin.   MAGNESIUM PO Take 1,000 mg by mouth daily.   metFORMIN 500 MG 24 hr tablet Commonly known as: GLUCOPHAGE-XR Take 1,000 mg by mouth daily.   metoprolol succinate 100 MG 24 hr tablet Commonly known as:  TOPROL-XL Take 100 mg by mouth daily.   multivitamin tablet Take 1 tablet by mouth daily.   mupirocin  ointment 2 % Commonly known as: BACTROBAN  Apply 1 Application topically 2 (two) times daily.   nortriptyline 50 MG capsule Commonly known as: PAMELOR Take 100 mg by mouth daily.   omeprazole 40 MG capsule Commonly known as: PRILOSEC Take by mouth.   oxyCODONE-acetaminophen 5-325 MG tablet Commonly known as: PERCOCET/ROXICET Take by mouth.   pentosan polysulfate 100 MG capsule Commonly known as: ELMIRON Take 100 mg by mouth 3 (three) times daily.   Riboflavin 25 MG Tabs Take by mouth 2 (two) times daily.   Ubrelvy 100 MG Tabs Generic drug: Ubrogepant Take by mouth.   Venlafaxine HCl 225 MG Tb24 Take 225 mg by mouth daily.   VITAMIN C PO Take 1 tablet by mouth daily.   VITAMIN D (CHOLECALCIFEROL) PO Take 1 tablet by mouth daily.        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 Her smoking use included cigarettes. She started smoking about 51 years ago. She has a 62.5 pack-year smoking history. She has quit using smokeless tobacco. She reports that she does not currently use alcohol. She reports that she does not use drugs.  Physical Exam: There were no vitals taken for this visit.  Constitutional:  Alert and oriented, no acute distress, nontoxic appearing HEENT: [place], AT Cardiovascular: No clubbing, cyanosis, or edema Respiratory: Normal respiratory effort, no increased work of breathing Skin: No rashes, bruises or suspicious lesions Neurologic: Grossly intact, no focal deficits, moving all 4  extremities Psychiatric: Normal mood and affect  Laboratory Data: Results for orders placed or performed in visit on 06/09/24  Microscopic Examination   Collection Time: 06/09/24  2:32 PM   Urine  Result Value Ref Range   WBC, UA 0-5 0 - 5 /hpf   RBC, Urine 0-2 0 - 2 /hpf   Epithelial Cells (non renal) >10 (A) 0 - 10 /hpf   Casts Present (A) None seen /lpf   Cast Type Hyaline casts N/A   Crystals Present (A) N/A   Crystal Type Calcium Oxalate N/A   Bacteria, UA Many (A) None seen/Few  Urinalysis, Complete   Collection Time: 06/09/24  2:32 PM  Result Value Ref Range   Specific Gravity, UA 1.025 1.005 - 1.030   pH, UA 6.0 5.0 - 7.5   Color, UA Yellow Yellow   Appearance Ur Slightly cloudy Clear   Leukocytes,UA Negative Negative   Protein,UA Trace Negative/Trace   Glucose, UA Negative Negative   Ketones, UA Trace (A) Negative   RBC, UA Negative Negative   Bilirubin, UA Negative Negative   Urobilinogen, Ur 0.2 0.2 - 1.0 mg/dL   Nitrite, UA Negative Negative   Microscopic Examination Comment    Microscopic Examination See below:    Assessment & Plan:   1. Microscopic hematuria (Primary) Resolved on UA today.  She is clinically not infected.  I do not see any history of calcium oxalate crystals on prior UAs and she denies flank pain.  Will defer additional imaging for now.  We discussed that if she develops gross hematuria or flank pain, she should return for imaging. - Urinalysis, Complete   Return in about 1 year (around 06/09/2025) for Annual f/u with UA.  Lucie Hones, PA-C  [facility] [place] [address], [address] [place], [place] [address] [phone]

## 2024-11-02 LAB — COLOGUARD: COLOGUARD: NEGATIVE

## 2025-06-08 ENCOUNTER — Ambulatory Visit: Admitting: Urology
